# Patient Record
Sex: Female | Born: 1995 | Race: White | Hispanic: No | Marital: Single | Smoking: Never smoker
Health system: Southern US, Community
[De-identification: ages and names within clinical notes are randomized; demographics above are authoritative.]

## PROBLEM LIST (undated history)

## (undated) DIAGNOSIS — N200 Calculus of kidney: Secondary | ICD-10-CM

## (undated) DIAGNOSIS — B009 Herpesviral infection, unspecified: Secondary | ICD-10-CM

## (undated) DIAGNOSIS — F419 Anxiety disorder, unspecified: Secondary | ICD-10-CM

## (undated) DIAGNOSIS — M797 Fibromyalgia: Secondary | ICD-10-CM

## (undated) DIAGNOSIS — A6000 Herpesviral infection of urogenital system, unspecified: Secondary | ICD-10-CM

## (undated) DIAGNOSIS — F32A Depression, unspecified: Secondary | ICD-10-CM

## (undated) DIAGNOSIS — F329 Major depressive disorder, single episode, unspecified: Secondary | ICD-10-CM

## (undated) DIAGNOSIS — G43909 Migraine, unspecified, not intractable, without status migrainosus: Secondary | ICD-10-CM

## (undated) DIAGNOSIS — I959 Hypotension, unspecified: Secondary | ICD-10-CM

## (undated) DIAGNOSIS — M069 Rheumatoid arthritis, unspecified: Secondary | ICD-10-CM

## (undated) DIAGNOSIS — K589 Irritable bowel syndrome without diarrhea: Secondary | ICD-10-CM

## (undated) DIAGNOSIS — K219 Gastro-esophageal reflux disease without esophagitis: Secondary | ICD-10-CM

## (undated) DIAGNOSIS — K319 Disease of stomach and duodenum, unspecified: Secondary | ICD-10-CM

## (undated) DIAGNOSIS — Q796 Ehlers-Danlos syndrome, unspecified: Secondary | ICD-10-CM

## (undated) DIAGNOSIS — E079 Disorder of thyroid, unspecified: Secondary | ICD-10-CM

## (undated) DIAGNOSIS — M329 Systemic lupus erythematosus, unspecified: Secondary | ICD-10-CM

## (undated) DIAGNOSIS — IMO0002 Reserved for concepts with insufficient information to code with codable children: Secondary | ICD-10-CM

## (undated) HISTORY — DX: Depression, unspecified: F32.A

## (undated) HISTORY — PX: HIP ARTHROSCOPY: SUR88

## (undated) HISTORY — DX: Ehlers-Danlos syndrome, unspecified: Q79.60

## (undated) HISTORY — DX: Gastro-esophageal reflux disease without esophagitis: K21.9

## (undated) HISTORY — DX: Irritable bowel syndrome, unspecified: K58.9

## (undated) HISTORY — DX: Reserved for concepts with insufficient information to code with codable children: IMO0002

## (undated) HISTORY — DX: Herpesviral infection of urogenital system, unspecified: A60.00

## (undated) HISTORY — DX: Systemic lupus erythematosus, unspecified: M32.9

## (undated) HISTORY — DX: Major depressive disorder, single episode, unspecified: F32.9

## (undated) HISTORY — DX: Calculus of kidney: N20.0

## (undated) HISTORY — DX: Hypotension, unspecified: I95.9

## (undated) HISTORY — DX: Fibromyalgia: M79.7

## (undated) HISTORY — DX: Disease of stomach and duodenum, unspecified: K31.9

## (undated) HISTORY — PX: KNEE ARTHROSCOPY: SHX127

## (undated) HISTORY — DX: Anxiety disorder, unspecified: F41.9

## (undated) HISTORY — DX: Disorder of thyroid, unspecified: E07.9

## (undated) HISTORY — DX: Migraine, unspecified, not intractable, without status migrainosus: G43.909

## (undated) HISTORY — DX: Rheumatoid arthritis, unspecified: M06.9

## (undated) HISTORY — DX: Herpesviral infection, unspecified: B00.9

---
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
## 2016-11-26 HISTORY — PX: NISSEN FUNDOPLICATION: SHX2091

## 2017-04-19 ENCOUNTER — Encounter: Payer: Self-pay | Admitting: Gastroenterology

## 2017-04-19 ENCOUNTER — Ambulatory Visit (INDEPENDENT_AMBULATORY_CARE_PROVIDER_SITE_OTHER): Payer: BLUE CROSS/BLUE SHIELD | Admitting: Family Medicine

## 2017-04-19 ENCOUNTER — Encounter: Payer: Self-pay | Admitting: Family Medicine

## 2017-04-19 VITALS — BP 90/58 | HR 88 | Temp 98.4°F | Ht 64.5 in | Wt 134.9 lb

## 2017-04-19 DIAGNOSIS — M797 Fibromyalgia: Secondary | ICD-10-CM | POA: Insufficient documentation

## 2017-04-19 DIAGNOSIS — M329 Systemic lupus erythematosus, unspecified: Secondary | ICD-10-CM

## 2017-04-19 DIAGNOSIS — F419 Anxiety disorder, unspecified: Secondary | ICD-10-CM

## 2017-04-19 DIAGNOSIS — K224 Dyskinesia of esophagus: Secondary | ICD-10-CM

## 2017-04-19 DIAGNOSIS — Q796 Ehlers-Danlos syndrome, unspecified: Secondary | ICD-10-CM

## 2017-04-19 DIAGNOSIS — M069 Rheumatoid arthritis, unspecified: Secondary | ICD-10-CM | POA: Diagnosis not present

## 2017-04-19 DIAGNOSIS — K219 Gastro-esophageal reflux disease without esophagitis: Secondary | ICD-10-CM

## 2017-04-19 DIAGNOSIS — G43809 Other migraine, not intractable, without status migrainosus: Secondary | ICD-10-CM

## 2017-04-19 DIAGNOSIS — G43909 Migraine, unspecified, not intractable, without status migrainosus: Secondary | ICD-10-CM | POA: Insufficient documentation

## 2017-04-19 NOTE — Progress Notes (Addendum)
error 

## 2017-04-19 NOTE — Progress Notes (Addendum)
HPI:  Joy Taylor is here to establish care. Moved here to do biology degree at [employer] and plans to go to PA school. She reports she is primarily here today to get referrals as was seeing a number of specialists in [place].  Reported hx Ehlers-Danlos, fibromyalgia, rheumatoid arthritis, Lupus: - was seeing two different rheumatologist in [place] - reports one thought she had RA and one thought she had lupus -wants referral to a rheumatologist here -on plaquenil, gabapentin, pilocarpine for side effect to her meds prn  Migraines: -seeing neurologist in [place] -meds: depakote -wants referral to neurologist here  GERD/Esophageal spasm: -was seeing GI in [place], hx of regurg and aspiration -hx nissen -wants referral to GI here as still dealing with epigastric pain since surgery in Jan 2018 -not on any medications now -no vomiting, severe or persistent pain, weight loss   Irr periods and contraception: -meds: microgestin -utd pap smear, normal 1 week ago  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 Anxiety   . Ehlers-Danlos disease   . Fibromyalgia   . Genital herpes    diagnosed by GYN-per patient no treatment advised unless daily flare ups occur  . Lupus   . Migraine   . Rheumatoid aortitis   . Stomach problems     Past Surgical History:  Procedure Laterality Date  . GASTRIC FUNDOPLICATION  34/19/3790  . HIP ARTHROPLASTY  07/15/2016, 08/06/2015  . ROTATOR CUFF REPAIR Right 2010    Family History  Problem Relation Age of Onset  . Diabetes Mother   .  Diverticulitis Mother   . Macular degeneration Maternal Grandmother   . Heart disease Maternal Grandmother   . Hypertension Maternal Grandfather     Social History   Social History  . Marital status: Single    Spouse name: N/A  . Number of children: N/A  . Years of education: N/A   Social History Main Topics  . Smoking status: Never Smoker  . Smokeless tobacco: Never Used  . Alcohol use None  . Drug use: Unknown  . Sexual activity: Not Asked   Other Topics Concern  . 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 Reports this triggered all of her health problems.        Current Outpatient Prescriptions:  .  divalproex (DEPAKOTE) 500 MG DR tablet, Take 500 mg by mouth 2 (two) times daily., Disp: , Rfl:  .  gabapentin (NEURONTIN) 300 MG capsule, Take 300 mg by mouth 4 (four) times daily., Disp: , Rfl:  .  hydroxychloroquine (PLAQUENIL) 200 MG tablet, Take 350 mg by mouth daily., Disp: , Rfl:  .  Norethindrone Acet-Ethinyl Est (MICROGESTIN 1.5/30 PO), Take by mouth., Disp: , Rfl:  .  ondansetron (ZOFRAN) 4 MG tablet, Take 4 mg by mouth 2 (two) times daily., Disp: , Rfl:  .  PILOCARPINE HCL PO, Take 4 mg by mouth as needed., Disp: , Rfl:  .  Polyethylene Glycol 3350 (MIRALAX PO), Take by  mouth daily., Disp: , Rfl:  .  SUMAtriptan (IMITREX) 6 MG/0.5ML SOLN injection, Inject 6 mg into the skin as needed for migraine or headache. May repeat in 2 hours if headache persists or recurs., Disp: , Rfl:   EXAM:  Vitals:   04/19/17 1509  BP: (!) 90/58  Pulse: 88  Temp: 98.4 F (36.9 C)    Body mass index is 22.8 kg/m.  GENERAL: vitals reviewed and listed above, alert, oriented, appears well hydrated and in no acute distress  HEENT: atraumatic, conjunttiva clear, no obvious abnormalities on inspection of external nose and ears  NECK: no obvious masses on  inspection  LUNGS: clear to auscultation bilaterally, no wheezes, rales or rhonchi, good air movement  CV: HRRR, no peripheral edema  MS: moves all extremities without noticeable abnormality  PSYCH: pleasant and cooperative, no obvious depression or anxiety, fairly flat affect  ASSESSMENT AND PLAN:  Discussed the following assessment and plan: More than 50% of over 30 minutes spent in total in caring for this patient was spent face-to-face with the patient, counseling and/or coordinating care. Referral placed per her request - she seems accustomed to specialist handling her many medical problems.   Fibromyalgia - Plan: Ambulatory referral to Rheumatology  Rheumatoid arthritis, involving unspecified site, unspecified rheumatoid factor presence ([place]) - Plan: Ambulatory referral to Rheumatology  Systemic lupus erythematosus, unspecified SLE type, unspecified organ involvement status ([place]) - Plan: Ambulatory referral to Rheumatology  Ehlers-Danlos disease  Other migraine without status migrainosus, not intractable - Plan: Ambulatory referral to Neurology  Gastroesophageal reflux disease, esophagitis presence not specified - Plan: Ambulatory referral to Gastroenterology  Esophageal spasm - Plan: Ambulatory referral to Gastroenterology  Anxiety - advised CBT with Behavioral health  -We reviewed the PMH, PSH, FH, SH, Meds and Allergies. -We have asked for records for pertinent exams, studies, vaccines and notes from previous providers. -referrals placed per her request -advised counseling and provided options and contact information to schedule - she is not interested in medications for mental health  -We have advised patient to follow up per instructions below.   -Patient advised to return or notify a doctor immediately if symptoms worsen or persist or new concerns arise.  Patient Instructions  BEFORE YOU LEAVE: -follow up: yearly for CPE and as needed  -We placed several  referrals for you as discussed. It usually takes about 1-2 weeks to process and schedule referrals. If you have not heard from [place] regarding these appointments in 2 weeks please contact our office.  Call the number provider for Behavioral Health/Counseling.  It was nice to meet you today.     WE NOW OFFER   [facility] FAST TRACK  SAME DAY Appointments for ACUTE CARE  Such as: Sprains, Injuries, cuts, abrasions, rashes, muscle pain, joint pain, back pain Colds, flu, sore throats, headache, allergies, cough, fever  Ear pain, sinus and eye infections Abdominal pain, nausea, vomiting, diarrhea, upset stomach Animal/insect bites  3 Easy Ways to Schedule: Walk-In Scheduling Call in scheduling Mychart Sign-up: [email or web address]                Colin Benton R.

## 2017-04-19 NOTE — Patient Instructions (Addendum)
BEFORE YOU LEAVE: -follow up: yearly for CPE and as needed  -We placed several referrals for you as discussed. It usually takes about 1-2 weeks to process and schedule referrals. If you have not heard from [place] regarding these appointments in 2 weeks please contact our office.  Call the number provider for Behavioral Health/Counseling.  It was nice to meet you today.     WE NOW OFFER   [facility] FAST TRACK  SAME DAY Appointments for ACUTE CARE  Such as: Sprains, Injuries, cuts, abrasions, rashes, muscle pain, joint pain, back pain Colds, flu, sore throats, headache, allergies, cough, fever  Ear pain, sinus and eye infections Abdominal pain, nausea, vomiting, diarrhea, upset stomach Animal/insect bites  3 Easy Ways to Schedule: Walk-In Scheduling Call in scheduling Mychart Sign-up: [email or web address]

## 2017-04-20 ENCOUNTER — Encounter: Payer: Self-pay | Admitting: Neurology

## 2017-05-06 NOTE — Progress Notes (Signed)
HPI:  Acute visit for:  Med refills. Has migraines - will be seeing neurologist soon but needs refill on Depakote and triptan to get to appt. Out the last 2 days. She also reports she thinks she gets hypoglycemic at times. Reports has intermittent spells occ of fatigue and sweating and when she eats something it resolves. has never checked her sugar at these times. No hx of diabetes.  Reports she is seeing rheumatologist locally now for ? RA, Harless Litten, ? lups and now ? [place]. Says he will send notes. Did imaging and lots of labs there. 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 Reports needs this for her anxiety as it is "not stable" and she therfore needs special assistance for school and work. Will need letters of medical need for this. Not seeing psychiatrist here yet. Does not want to take medications for anxiety. Has denied depression.   ROS: See pertinent positives and negatives per HPI.  Past Medical History:  Diagnosis Date  . Anxiety   . Depression   . Ehlers-Danlos disease   . Fibromyalgia   . Genital herpes    diagnosed by GYN-per patient no treatment advised unless daily flare ups occur  . GERD (gastroesophageal reflux disease)   . Kidney stones   . Lupus   . Migraine   . Rheumatoid aortitis   . Stomach problems     Past Surgical History:  Procedure Laterality Date  . GASTRIC FUNDOPLICATION  23/55/7322  . HIP ARTHROPLASTY  07/15/2016, 08/06/2015  . ROTATOR CUFF REPAIR Right 2010    Family History  Problem Relation Age of Onset  . Diabetes Mother   . Diverticulitis Mother   . Macular degeneration Maternal Grandmother   . Heart disease Maternal Grandmother   . Hypertension Maternal Grandfather     Social History   Social History  . Marital status: Single    Spouse name: N/A  . Number of children: N/A  . Years of education: N/A   Social History Main Topics  . Smoking status: Never Smoker  .  Smokeless tobacco: Never Used  . Alcohol use None  . Drug use: Unknown  . Sexual activity: Not Asked   Other Topics Concern  . 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 Reports this triggered all of her health problems.        Current Outpatient Prescriptions:  .  divalproex (DEPAKOTE) 500 MG DR tablet, Take 1 tablet (500 mg total) by mouth 2 (two) times daily., Disp: 60 tablet, Rfl: 0 .  gabapentin (NEURONTIN) 300 MG capsule, Take 300 mg by mouth 4 (four) times daily., Disp: , Rfl:  .  hydroxychloroquine (PLAQUENIL) 200 MG tablet, Take 350 mg by mouth daily., Disp: , Rfl:  .  Norethindrone Acet-Ethinyl Est (MICROGESTIN 1.5/30 PO), Take by mouth., Disp: , Rfl:  .  ondansetron (ZOFRAN) 4 MG tablet, Take 4 mg by mouth 2 (two) times daily., Disp: , Rfl:  .  PILOCARPINE HCL PO, Take 4 mg by mouth as needed., Disp: , Rfl:  .  Polyethylene Glycol 3350 (MIRALAX PO), Take by mouth daily., Disp: , Rfl:  .  SUMAtriptan (IMITREX) 6 MG/0.5ML SOLN injection, Inject 0.5 mLs (6 mg total) into the skin once. May repeat in 2 hours if headache persists or recurs., Disp: 0.5  mL, Rfl: 1  EXAM:  Vitals:   05/07/17 1443  BP: 96/68  Pulse: 84  Temp: 98.1 F (36.7 C)    Body mass index is 22.88 kg/m.  GENERAL: vitals reviewed and listed above, alert, oriented, appears well hydrated and in no acute distress  HEENT: atraumatic, conjunttiva clear, no obvious abnormalities on inspection of external nose and ears  NECK: no obvious masses on inspection  LUNGS: clear to auscultation bilaterally, no wheezes, rales or rhonchi, good air movement  CV: HRRR, no peripheral edema  MS: moves all extremities without noticeable abnormality  PSYCH: pleasant and cooperative, no obvious depression or anxiety  ASSESSMENT AND PLAN:  Discussed the following assessment and plan:  Other  migraine without status migrainosus, not intractable -refilled meds as bridge to neurology appt  Hypoglycemia -home glu monitor provided and advised to check blood sugar with these spells and follow up with [place] to let us know results  GAD (generalized anxiety disorder) -advised psychiatry evaluation and management to assist in any needed documentation for mental health disability and also to assist in hopefully helping to get her to a more functional point given her ambitious career goals  -Patient advised to return or notify a doctor immediately if symptoms worsen or persist or new concerns arise.  Patient Instructions  BEFORE YOU LEAVE: Glucose monitor. Contact innformation for [facility].  Check blood sugar when you feel is low and keep log. Let us know if this is running low and the readings.  Send refills on your migraine medications to last until your appointment with the neurologist.  Call today to set up an appointment with the psychiatry office regarding for anxiety and mental health.    Colin Benton R., DO

## 2017-05-07 ENCOUNTER — Encounter: Payer: Self-pay | Admitting: Family Medicine

## 2017-05-07 ENCOUNTER — Ambulatory Visit (INDEPENDENT_AMBULATORY_CARE_PROVIDER_SITE_OTHER): Payer: BLUE CROSS/BLUE SHIELD | Admitting: Family Medicine

## 2017-05-07 VITALS — BP 96/68 | HR 84 | Temp 98.1°F | Wt 135.4 lb

## 2017-05-07 DIAGNOSIS — F411 Generalized anxiety disorder: Secondary | ICD-10-CM

## 2017-05-07 DIAGNOSIS — E162 Hypoglycemia, unspecified: Secondary | ICD-10-CM | POA: Diagnosis not present

## 2017-05-07 DIAGNOSIS — G43809 Other migraine, not intractable, without status migrainosus: Secondary | ICD-10-CM | POA: Diagnosis not present

## 2017-05-07 MED ORDER — SUMATRIPTAN SUCCINATE 6 MG/0.5ML ~~LOC~~ SOLN: 6.0000 mg | Freq: Once | SUBCUTANEOUS | 1 refills | Status: DC

## 2017-05-07 MED ORDER — DIVALPROEX SODIUM 500 MG PO DR TAB: 500.0000 mg | DELAYED_RELEASE_TABLET | Freq: Two times a day (BID) | ORAL | 0 refills | Status: DC

## 2017-05-07 NOTE — Patient Instructions (Signed)
BEFORE YOU LEAVE: Glucose monitor. Contact innformation for [facility].  Check blood sugar when you feel is low and keep log. Let us know if this is running low and the readings.  Send refills on your migraine medications to last until your appointment with the neurologist.  Call today to set up an appointment with the psychiatry office regarding for anxiety and mental health.

## 2017-05-25 ENCOUNTER — Encounter: Payer: Self-pay | Admitting: Gastroenterology

## 2017-05-25 ENCOUNTER — Encounter (INDEPENDENT_AMBULATORY_CARE_PROVIDER_SITE_OTHER): Payer: Self-pay

## 2017-05-25 ENCOUNTER — Ambulatory Visit (INDEPENDENT_AMBULATORY_CARE_PROVIDER_SITE_OTHER): Payer: BLUE CROSS/BLUE SHIELD | Admitting: Gastroenterology

## 2017-05-25 ENCOUNTER — Other Ambulatory Visit (INDEPENDENT_AMBULATORY_CARE_PROVIDER_SITE_OTHER): Payer: BLUE CROSS/BLUE SHIELD

## 2017-05-25 VITALS — BP 84/56 | HR 76 | Ht 64.75 in | Wt 133.0 lb

## 2017-05-25 DIAGNOSIS — R0789 Other chest pain: Secondary | ICD-10-CM

## 2017-05-25 DIAGNOSIS — R109 Unspecified abdominal pain: Secondary | ICD-10-CM

## 2017-05-25 DIAGNOSIS — R140 Abdominal distension (gaseous): Secondary | ICD-10-CM | POA: Diagnosis not present

## 2017-05-25 DIAGNOSIS — R1310 Dysphagia, unspecified: Secondary | ICD-10-CM

## 2017-05-25 DIAGNOSIS — Z9889 Other specified postprocedural states: Secondary | ICD-10-CM | POA: Diagnosis not present

## 2017-05-25 LAB — IGA: IgA: 170 mg/dL (ref 68–378)

## 2017-05-25 MED ORDER — OMEPRAZOLE 40 MG PO CPDR: 40.0000 mg | DELAYED_RELEASE_CAPSULE | Freq: Every day | ORAL | 1 refills | Status: DC

## 2017-05-25 NOTE — Progress Notes (Signed)
HPI :  21 y/o female with a history of Ehlers'Danlos syndrome, RA / SLE, and history of reflux who had Nissen fundoplication in 07/6294, seen in consultation for Dr. Colin Benton today for chest and upper abdominal symptoms. She reported severe regurgitation of gastric contents refractory to medications, which led to this surgery in January, she states she had a large hiatal hernia with aspiration in the past from it as well.   She reports since the surgery she reports her regurgitation has completely resolved, but she has had some new symptoms that have arose. She reports she still has some "spasms" in her lower chest. She reports some dysphagia at times with solids, usually if she eats too fast. She reports initially after the surgery it was severe, but now dysphagia getting better over time. She feels the dysphagia mostly up high in her chest. She has some discomfort in her lower chest after she eats, "stabbing pains", which can occur at night or after she eats. She feels it for roughly 30 minutes after she eats. She has not been able to belch, and does have some significant bloating in her stomach after she eats. She is not sure if she is having heartburn. She feels more so a "spasm".   She had an UGI series on 03/05/2017 which I reviewed - no abnormality reported, surgical site looked okay.   She's had a prior EGD, she does not think she had a manometry prior to her surgery. She thinks she may have had a gastric emptying study, but no records available. She has a history of IBS-C, not much loose stools. She takes miralax daily to help regulate her bowels and currently these symptoms are under good control.     Past Medical History:  Diagnosis Date  . Anxiety   . Depression   . Ehlers-Danlos disease   . Fibromyalgia   . Genital herpes    diagnosed by GYN-per patient no treatment advised unless daily flare ups occur  . GERD (gastroesophageal reflux disease)   . IBS (irritable bowel syndrome)     . Kidney stones   . Lupus   . Migraine   . Rheumatoid arthritis ([place])   . Stomach problems      Past Surgical History:  Procedure Laterality Date  . HIP ARTHROSCOPY Right 07/15/2016, 08/06/2015  . NISSEN FUNDOPLICATION  28/41/3244  . ROTATOR CUFF REPAIR Right 2010   Family History  Problem Relation Age of Onset  . Diabetes Mother   . Diverticulitis Mother   . Colon polyps Mother   . Macular degeneration Maternal Grandmother   . Heart disease Maternal Grandmother   . Hypertension Maternal Grandfather    Social History  Substance Use Topics  . Smoking status: Never Smoker  . Smokeless tobacco: Never Used  . Alcohol use No   Current Outpatient Prescriptions  Medication Sig Dispense Refill  . divalproex (DEPAKOTE) 500 MG DR tablet Take 1 tablet (500 mg total) by mouth 2 (two) times daily. 60 tablet 0  . gabapentin (NEURONTIN) 300 MG capsule Take 300 mg by mouth 4 (four) times daily.    . hydroxychloroquine (PLAQUENIL) 200 MG tablet Take 350 mg by mouth daily.    . Norethindrone Acet-Ethinyl Est (MICROGESTIN 1.5/30 PO) Take by mouth.    . ondansetron (ZOFRAN) 4 MG tablet Take 4 mg by mouth 2 (two) times daily.    Marland Kitchen PILOCARPINE HCL PO Take 4 mg by mouth as needed.    . Polyethylene Glycol 3350 (MIRALAX  PO) Take by mouth daily.    . SUMAtriptan (IMITREX) 6 MG/0.5ML SOLN injection Inject 0.5 mLs (6 mg total) into the skin once. May repeat in 2 hours if headache persists or recurs. 0.5 mL 1   No current facility-administered medications for this visit.    No Known Allergies   Review of Systems: All systems reviewed and negative except where noted in HPI.   No results found for: WBC, HGB, HCT, MCV, PLT   Physical Exam: BP (!) 84/56 (BP Location: Left Arm, Patient Position: Sitting, Cuff Size: Normal)   Pulse 76   Ht 5' 4.75" (1.645 m) Comment: height measured without shoes  Wt 133 lb (60.3 kg)   LMP 05/18/2017   BMI 22.30 kg/m  Constitutional: Pleasant,well-developed,  female in no acute distress. HEENT: Normocephalic and atraumatic. Conjunctivae are normal. No scleral icterus. Neck supple.  Cardiovascular: Normal rate, regular rhythm.  Pulmonary/chest: Effort normal and breath sounds normal. No wheezing, rales or rhonchi. Abdominal: Soft, nondistended, nontender. There are no masses palpable. No hepatomegaly. Extremities: no edema Lymphadenopathy: No cervical adenopathy noted. Neurological: Alert and oriented to person place and time. Skin: Skin is warm and dry. No rashes noted. Psychiatric: Normal mood and affect. Behavior is normal.   ASSESSMENT AND PLAN: 21 year old female with history as above, s/p Nissen in January 2018, here for chest symptoms that have intervally started after her surgery. Regurgitation has completely resolved, now with periodic dysphagia and intermittent chest discomfort and abdominal bloating in the postprandial setting.   We discussed that dysphagia is common after having a Nissen, and treatment of this with dilation could disrupt her surgical site and with potential for recurrent reflux. If her dysphagia is mild and continuing to improve I would continue to monitor this and avoid intervention at this time.   In regards to her postprandial chest discomfort and bloating, I suspect she does have a component of gas bloat syndrome following her surgery, and she also may either have esophageal spasm or perhaps a component of reflux. Going to recommend over-the-counter simethicone with meals to help reduce gas. She can try using some peppermint althoids for empiric treatment of spasm see if this helps at all. I'll also give her a trial of omeprazole to see if this makes any difference. If no improvement in symptoms persist may consider an endoscopy but will hold off on this for now.   Otherwise for her bloating, some of this could also be due to underlying IBS. Discussed trial of low FODMAP diet with her and she wants to try this. I will  also screen for celiac disease to ensure negative.  She agreed with the plan as outlined she can follow-up with me as needed.  Payette Cellar, MD [facility] Pager [phone]  CC: Colin Benton, DO

## 2017-05-25 NOTE — Patient Instructions (Signed)
If you are age 23 or older, your body mass index should be between 23-30. Your Body mass index is 22.3 kg/m. If this is out of the aforementioned range listed, please consider follow up with your Primary Care Provider.  If you are age 53 or younger, your body mass index should be between 19-25. Your Body mass index is 22.3 kg/m. If this is out of the aformentioned range listed, please consider follow up with your Primary Care Provider.   We have sent the following medications to your pharmacy for you to pick up at your convenience:  Omeprazole  Your physician has requested that you go to the basement for the following lab work before leaving today:  Celiac Labs  You have been given a Low FodMap diet to follow.  Please purchase over the counter Simeticone and use as needed.  Please follow up as needed with Dr. Havery Moros.  Thank you.

## 2017-05-26 LAB — TISSUE TRANSGLUTAMINASE, IGA: Tissue Transglutaminase Ab, IgA: 1 U/mL (ref <4)

## 2017-05-27 ENCOUNTER — Encounter: Payer: Self-pay | Admitting: Gastroenterology

## 2017-06-03 ENCOUNTER — Other Ambulatory Visit: Payer: Self-pay | Admitting: Family Medicine

## 2017-06-03 NOTE — Telephone Encounter (Signed)
I left a message for the pt to return my call. 

## 2017-06-03 NOTE — Telephone Encounter (Signed)
We had provided short bridge to her specialist appointment. When will she see neurology? Can refill once for 30 days if needed to get to that appt.

## 2017-06-07 NOTE — Telephone Encounter (Signed)
No answer at the pts cell number. °

## 2017-06-08 NOTE — Telephone Encounter (Signed)
Patient has not returned a call to the office as of today.  Rx denial sent to the pts pharmacy to call the office for an appt.

## 2017-06-17 ENCOUNTER — Other Ambulatory Visit: Payer: Self-pay | Admitting: Family Medicine

## 2017-07-21 ENCOUNTER — Other Ambulatory Visit: Payer: Self-pay | Admitting: Gastroenterology

## 2017-07-23 ENCOUNTER — Telehealth: Payer: Self-pay | Admitting: Family Medicine

## 2017-07-23 ENCOUNTER — Ambulatory Visit: Payer: BLUE CROSS/BLUE SHIELD | Admitting: Neurology

## 2017-07-23 NOTE — Telephone Encounter (Signed)
Pt would like a neurologist who specialize in fibromyaglia and dr Tomi Likens does not

## 2017-07-23 NOTE — Telephone Encounter (Signed)
I left a message for the pt to return my call. 

## 2017-07-23 NOTE — Telephone Encounter (Signed)
I am not aware of a neurologist in the area that specializes in fibromyalgia - this is something rheumatologist are usually more knowledgeable about.  If she is aware of a neurologist that specializes in this and could provide the name, happy to re-refer. The referral to Dr. Tomi Likens was for migraines.

## 2017-07-26 ENCOUNTER — Ambulatory Visit: Payer: BLUE CROSS/BLUE SHIELD | Admitting: Neurology

## 2017-07-30 ENCOUNTER — Ambulatory Visit: Payer: BLUE CROSS/BLUE SHIELD | Admitting: Family Medicine

## 2017-08-02 NOTE — Telephone Encounter (Signed)
I called the pt and informed her of the message below

## 2017-08-05 ENCOUNTER — Ambulatory Visit (INDEPENDENT_AMBULATORY_CARE_PROVIDER_SITE_OTHER): Payer: BLUE CROSS/BLUE SHIELD | Admitting: Family Medicine

## 2017-08-05 ENCOUNTER — Encounter: Payer: Self-pay | Admitting: Family Medicine

## 2017-08-05 VITALS — BP 98/62 | HR 78 | Temp 98.6°F | Wt 141.8 lb

## 2017-08-05 DIAGNOSIS — M797 Fibromyalgia: Secondary | ICD-10-CM | POA: Diagnosis not present

## 2017-08-05 DIAGNOSIS — Q796 Ehlers-Danlos syndrome, unspecified: Secondary | ICD-10-CM

## 2017-08-05 DIAGNOSIS — M069 Rheumatoid arthritis, unspecified: Secondary | ICD-10-CM | POA: Diagnosis not present

## 2017-08-05 DIAGNOSIS — M329 Systemic lupus erythematosus, unspecified: Secondary | ICD-10-CM | POA: Diagnosis not present

## 2017-08-05 DIAGNOSIS — M79661 Pain in right lower leg: Secondary | ICD-10-CM | POA: Diagnosis not present

## 2017-08-05 DIAGNOSIS — E162 Hypoglycemia, unspecified: Secondary | ICD-10-CM

## 2017-08-05 MED ORDER — NORETHINDRONE ACET-ETHINYL EST 1.5-30 MG-MCG PO TABS: 1.0000 | ORAL_TABLET | Freq: Every day | ORAL | 3 refills | Status: DC

## 2017-08-05 NOTE — Patient Instructions (Signed)
BEFORE YOU LEAVE: -calf strain exercises -obtain labs/notes from rheumatologist -follow up: in one month if symptoms persist and may need to do labs and neurology referral  Do the exercises provided gently 4 days per week.  Topical menthol or capsaicin for discomfort if needed and heat for 15 minutes twice daily.  -We placed a referral for you as discussed to the endocrinologist. It usually takes about 1-2 weeks to process and schedule this referral. If you have not heard from [place] regarding this appointment in 2 weeks please contact our office.  -Please let us know if you decide what neurologist you wish to see

## 2017-08-05 NOTE — Progress Notes (Signed)
HPI:  Acute visit for:  R calf discomfort: -for about 2 weeks -mild pain intermittently, intermittent cold feeling in one spot post calf -no known injury, weakness, numbness, fevers, malaise, symptoms elsewhere, Swelling, redness  Requests referral to endocrinologist. Reports rheumatologist told her needs referral to endo for hypoglycemia and multiple other issues, but she is not sure what. She has not confirmed hypoglycemia, but feel Glu is always low "in 70s" when checked on labs and feels she has hypoglycemic episodes. Feels fatigued, then feels better when she eats.  Did not see neurologist. Wants a neurologist to manage her fibromyalgia and migraines as saw a neurolgoist in the past whom was very knowledgeable about and managed fibromyalgia. She reports she is doing her research on who she wants to see.  Requests refill on birth control pill. Currently on period. Denies any missed doses.  Screening for STI's.  Declines flu shot.  ROS: See pertinent positives and negatives per HPI.  Past Medical History:  Diagnosis Date  . Anxiety   . Depression   . Ehlers-Danlos disease   . Fibromyalgia   . Genital herpes    diagnosed by GYN-per patient no treatment advised unless daily flare ups occur  . GERD (gastroesophageal reflux disease)   . IBS (irritable bowel syndrome)   . Kidney stones   . Lupus   . Migraine   . Rheumatoid arthritis ([place])   . Stomach problems     Past Surgical History:  Procedure Laterality Date  . HIP ARTHROSCOPY Right 07/15/2016, 08/06/2015  . NISSEN FUNDOPLICATION  69/67/8938  . ROTATOR CUFF REPAIR Right 2010    Family History  Problem Relation Age of Onset  . Diabetes Mother   . Diverticulitis Mother   . Colon polyps Mother   . Macular degeneration Maternal Grandmother   . Heart disease Maternal Grandmother   . Hypertension Maternal Grandfather     Social History   Social History  . Marital status: Single    Spouse name: N/A  . Number of  children: 0  . Years of education: N/A   Occupational History  . [employer]    Social History Main Topics  . Smoking status: Never Smoker  . Smokeless tobacco: Never Used  . Alcohol use No  . Drug use: No  . Sexual activity: Not Asked   Other Topics Concern  . 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 Reports this triggered all of her health problems.        Current Outpatient Prescriptions:  .  divalproex (DEPAKOTE) 500 MG DR tablet, Take 1 tablet (500 mg total) by mouth 2 (two) times daily., Disp: 60 tablet, Rfl: 0 .  gabapentin (NEURONTIN) 300 MG capsule, Take 300 mg by mouth 4 (four) times daily., Disp: , Rfl:  .  hydroxychloroquine (PLAQUENIL) 200 MG tablet, Take 350 mg by mouth daily., Disp: , Rfl:  .  Norethindrone Acetate-Ethinyl Estradiol (MICROGESTIN) 1.5-30 MG-MCG tablet, Take 1 tablet by mouth daily., Disp: 3 Package, Rfl: 3 .  omeprazole (PRILOSEC) 40 MG capsule, TAKE 1 CAPSULE BY MOUTH EVERY DAY, Disp: 30 capsule, Rfl: 1 .  ondansetron (ZOFRAN) 4 MG tablet, Take 4 mg by mouth 2 (two) times daily., Disp: , Rfl:  .  PILOCARPINE HCL PO, Take 4 mg by mouth as needed., Disp: , Rfl:  .  Polyethylene  Glycol 3350 (MIRALAX PO), Take by mouth daily., Disp: , Rfl:  .  SUMAtriptan (IMITREX) 6 MG/0.5ML SOLN injection, Inject 0.5 mLs (6 mg total) into the skin once. May repeat in 2 hours if headache persists or recurs., Disp: 0.5 mL, Rfl: 1  EXAM:  Vitals:   08/05/17 1309  BP: 98/62  Pulse: 78  Temp: 98.6 F (37 C)  SpO2: 98%    Body mass index is 23.78 kg/m.  GENERAL: vitals reviewed and listed above, alert, oriented, appears well hydrated and in no acute distress  HEENT: atraumatic, conjunttiva clear, no obvious abnormalities on inspection of external nose and ears  NECK: no obvious masses on inspection  LUNGS: clear to auscultation  bilaterally, no wheezes, rales or rhonchi, good air movement  CV: HRRR, no peripheral edema, no TTP over deep veins  MS/NEURO: moves all extremities without noticeable abnormality, no overlying redness, swelling or skin changes, minimal TTP in post lat calf muscle, normal strength/sensitivity to light touch and DTRs in lower extremities bilat  PSYCH: pleasant and cooperative, no obvious depression or anxiety  ASSESSMENT AND PLAN:  Discussed the following assessment and plan:  Right calf pain -we discussed possible serious and likely etiologies, workup and treatment, treatment risks and return precautions, ? Muscular  -after this discussion, Celise opted for treatment per instructions, obtain labs from rheum (TSH, b12, duplex and neurology referral if persists) -follow up advised 1 month -of course, we advised Tareva  to return or notify a doctor immediately if symptoms worsen or persist or new concerns arise.  Hypoglycemia - -patient reported -she has not been checking blood sugar during these episodes with the monitor we gave her -referral per her adamant request, also will obtain rheum notes - not sure why they would have advised endo referral without placing  Rheumatoid arthritis, involving unspecified site, unspecified rheumatoid factor presence (HCC) Ehlers-Danlos disease Systemic lupus erythematosus, unspecified SLE type, unspecified organ involvement status ([place]) Fibromyalgia -sees rheumatologist  Refilled birth control  -Patient advised to return or notify a doctor immediately if symptoms worsen or persist or new concerns arise.  Patient Instructions  BEFORE YOU LEAVE: -calf strain exercises -obtain labs/notes from rheumatologist -follow up: in one month if symptoms persist and may need to do labs and neurology referral  Do the exercises provided gently 4 days per week.  Topical menthol or capsaicin for discomfort if needed and heat for 15 minutes twice daily.  -We  placed a referral for you as discussed to the endocrinologist. It usually takes about 1-2 weeks to process and schedule this referral. If you have not heard from [place] regarding this appointment in 2 weeks please contact our office.  -Please let us know if you decide what neurologist you wish to see    Joy Taylor., DO

## 2017-09-22 ENCOUNTER — Telehealth: Payer: Self-pay | Admitting: Family Medicine

## 2017-09-22 DIAGNOSIS — M797 Fibromyalgia: Secondary | ICD-10-CM

## 2017-09-22 DIAGNOSIS — G43809 Other migraine, not intractable, without status migrainosus: Secondary | ICD-10-CM

## 2017-09-22 NOTE — Telephone Encounter (Signed)
Pt has found neurologist that will see her for headaches and fibromyalgia. [facility] [address]  [address]. Phone [phone]

## 2017-09-23 ENCOUNTER — Other Ambulatory Visit: Payer: Self-pay | Admitting: Gastroenterology

## 2017-09-23 NOTE — Telephone Encounter (Signed)
Referral placed and I was unable to leave a message at her cell number due to the voicemail being full.

## 2017-12-06 ENCOUNTER — Ambulatory Visit: Payer: Self-pay | Admitting: *Deleted

## 2017-12-06 NOTE — Telephone Encounter (Signed)
Called in c/o having a white vaginal discharge with itching after taking a course of Amoxicillin for a sinus infection while in [place].   She went to the urgent care center in [place] and was prescribed the Amoxicillin. I went over the home care advice with her.  She is going to try the Monistat 7 day course OTC.   She verbalized understanding of when to call us back if she did not improve within the next 3-7 days after starting the Monistat.    Reason for Disposition . Symptoms of a vaginal yeast infection (i.e., white, thick, cottage-cheese-like, itchy, not bad smelling discharge)  Answer Assessment - Initial Assessment Questions 1. DISCHARGE: "Describe the discharge." (e.g., white, yellow, green, gray, foamy, cottage cheese-like)     White 2. ODOR: "Is there a bad odor?"     Iching and yes bad odor 3. ONSET: "When did the discharge begin?"     A few days   3 days 4. RASH: "Is there a rash in that area?" If so, ask: "Describe it." (e.g., redness, blisters, sores, bumps)     No 5. ABDOMINAL PAIN: "Are you having any abdominal pain?" If yes: "What does it feel like?" (e.g., crampy, dull, intermittent, constant)      Having cramps it's hard to tell because I have IBS 6. ABDOMINAL PAIN SEVERITY: If present, ask: "How bad is it?"  (e.g., Scale 1-10; mild, moderate, or severe)   - MILD (1-3): doesn't interfere with normal activities, abdomen soft and not tender to touch    - MODERATE (4-7): interferes with normal activities or awakens from sleep, tender to touch    - SEVERE (8-10): excruciating pain, doubled over, unable to do any normal activities     Irritation.   Uncomfortable 7. CAUSE: "What do you think is causing the discharge?"     Amoxicillin for a sinus infection while I was in [place] 8. OTHER SYMPTOMS: "Do you have any other symptoms?" (e.g., fever, itching, vaginal bleeding, pain with urination)     Itching.   No fever or bleeding or burning 9. EDD: "What date are you expecting to deliver?"     N/A 10. PREGNANCY: "How many weeks pregnant are you?"       No  Protocols used: PREGNANCY - VAGINAL DISCHARGE-A-AH

## 2017-12-20 ENCOUNTER — Other Ambulatory Visit: Payer: Self-pay | Admitting: Gastroenterology

## 2017-12-22 ENCOUNTER — Telehealth: Payer: Self-pay | Admitting: Neurology

## 2017-12-22 ENCOUNTER — Ambulatory Visit: Payer: BLUE CROSS/BLUE SHIELD | Admitting: Neurology

## 2017-12-22 ENCOUNTER — Telehealth: Payer: Self-pay | Admitting: Family Medicine

## 2017-12-22 NOTE — Telephone Encounter (Signed)
I spoke with pt and she has appointment scheduled for February 16, 2018 with neurology. She is requesting 30 day supply with refill until her appointment if possible. She went for a office visit and stated that she was a few minutes late for that appointment, advised to reschedule.

## 2017-12-22 NOTE — Telephone Encounter (Signed)
Pt showed up 20 min late for apt. Wants a call from the nurse regarding medication (Depakote and gabapentin). She is resch for April.  Best call back [phone]

## 2017-12-22 NOTE — Telephone Encounter (Signed)
I scheduled patient with Dr. Jannifer Franklin on 12-29-17 at 9am check in at 8:30am and advised her if medication questions address with her PCP.per previous message.

## 2017-12-22 NOTE — Telephone Encounter (Signed)
Called and LVM for pt to call.   We can fit her back in next Wednesday, 12/29/17 at [place], [place] 830am for a new patient appt.   Her medication questions will have to be addressed with PCP until she can be seen here in the office.

## 2017-12-22 NOTE — Telephone Encounter (Signed)
Looks like patient called back and agreed to appt on 12/29/17 at 9am with CW,MD

## 2017-12-22 NOTE — Telephone Encounter (Signed)
No showed for appointment for new patient appointment

## 2017-12-22 NOTE — Telephone Encounter (Signed)
Per Dr. Eugenie Birks, we cannot provide any medical advice until patient is seen in the office. We can offer to work pt back in sooner per CW, MD. She needs to address any medication questions with PCP until she is seen in our office.

## 2017-12-22 NOTE — Telephone Encounter (Signed)
Copied from [place]. Topic: Quick Communication - See Telephone Encounter >> Dec 22, 2017  9:02 AM Aurelio Brash B wrote: CRM for notification. See Telephone encounter for:  Pt is asking for a call from Dr Noel Journey nurse to discuss divalproex (DEPAKOTE) 500 MG DR tablet,  she states her pharmacy is out of the med   12/22/17.[id number] [place], [address] [place] [phone] (Phone) [phone] (Fax)

## 2017-12-22 NOTE — Telephone Encounter (Signed)
Please call patient. She is supposed to be seeing a neurologist for this. From review of her chart it looks like she did not go to her neurology appointment, but thankfully they have agreed to see her 12/29/17. Ok to refill for 2 weeks to get to that appointment. Thanks.

## 2017-12-23 ENCOUNTER — Encounter: Payer: Self-pay | Admitting: Neurology

## 2017-12-23 MED ORDER — DIVALPROEX SODIUM 500 MG PO DR TAB: 500.0000 mg | DELAYED_RELEASE_TABLET | Freq: Two times a day (BID) | ORAL | 0 refills | Status: DC

## 2017-12-23 NOTE — Telephone Encounter (Signed)
I called Dr Jannifer Franklin' office and spoke with Lovey Newcomer and she stated the pt has an appt on 2/13 at 8:30am.  I called the pt and left a detailed message at her cell number with this information and a week's supply of Depakote was sent to her pharmacy.

## 2017-12-23 NOTE — Telephone Encounter (Signed)
Can you check with the [facility] office (Dr. Jannifer Franklin) when her appointment is? Their notes in Epic say they confirmed appt with her for 12/29/17. I don't want her to miss another appointment as they may not agree to see her then. Then call pt and let her know appointment date. Refill medication to the appointment. Thanks.

## 2017-12-24 NOTE — Telephone Encounter (Signed)
Patient states pharmacy has not received her refill, requesting call once sent [phone]

## 2017-12-24 NOTE — Telephone Encounter (Signed)
I called [facility], spoke with Lattie Haw and she stated the Rx is ready for the pt to pick up.  I called the pt and informed her of this.

## 2017-12-29 ENCOUNTER — Ambulatory Visit (INDEPENDENT_AMBULATORY_CARE_PROVIDER_SITE_OTHER): Payer: BLUE CROSS/BLUE SHIELD | Admitting: Neurology

## 2017-12-29 ENCOUNTER — Encounter (INDEPENDENT_AMBULATORY_CARE_PROVIDER_SITE_OTHER): Payer: Self-pay

## 2017-12-29 ENCOUNTER — Encounter: Payer: Self-pay | Admitting: Neurology

## 2017-12-29 VITALS — BP 124/88 | HR 80 | Ht 64.5 in | Wt 142.5 lb

## 2017-12-29 DIAGNOSIS — R413 Other amnesia: Secondary | ICD-10-CM | POA: Diagnosis not present

## 2017-12-29 DIAGNOSIS — G43809 Other migraine, not intractable, without status migrainosus: Secondary | ICD-10-CM

## 2017-12-29 DIAGNOSIS — Z5181 Encounter for therapeutic drug level monitoring: Secondary | ICD-10-CM

## 2017-12-29 DIAGNOSIS — M797 Fibromyalgia: Secondary | ICD-10-CM | POA: Diagnosis not present

## 2017-12-29 MED ORDER — GABAPENTIN 600 MG PO TABS: 600.0000 mg | ORAL_TABLET | Freq: Three times a day (TID) | ORAL | 3 refills | Status: DC

## 2017-12-29 NOTE — Progress Notes (Signed)
Reason for visit: Fibromyalgia, migraine  Referring physician: Dr. Maudie Mercury  Joy Taylor is a 22 y.o. female  History of present illness:  Joy Taylor is a 22 year old left-handed white female with an impressive list of medical problems at such a young age.  The patient has a history of Ehlers-Danlos syndrome, she claims that this was diagnosed through a Dietitian in [place].  The patient has a history of migraine headaches over the last 3 or 4 years, she has moved from [place] area to [place], she indicates that her neurologist started her on Depakote for her migraine which has been helpful.  The patient indicates that since she has started methotrexate for her lupus and rheumatoid arthritis her migraine headaches have become more frequent, occurring 2 or 3 times a week.  The headaches may be in the back of the head or around the eyes, not associated with nausea or vomiting, but the patient claims to have chronic nausea issues.  She is followed through a gastroenterologist for this.  The patient reports photophobia and phonophobia.  She indicates that stress will increase her headaches but she does not note any other activating factors.  She denies any family history of headache.  The patient claims that she was given the diagnosis of fibromyalgia 2 years ago with trigger points throughout the body including the neck, shoulders, back, hips, thighs.  The patient however also carries the diagnosis of rheumatoid arthritis and lupus, she is on Plaquenil and methotrexate.  She is followed through rheumatology at this time.  The patient has been treated with gabapentin currently on 300 mg 4 times daily with some benefit, but the fibromyalgia pain has worsened over the last 6 months to year.  In the past, the patient has been on Cymbalta but had cognitive side effects on this medication.  The patient reports memory problems as well.  She has word finding problems, she has chronic fatigue.   She does not sleep well at night.  The patient has difficulty recognizing faces of people that she knows.  She reports some numbness on the left side of the body.  She has a history of anxiety and depression.  She has irritable bowel syndrome as well, she has recently had arthroscopic surgery on her right knee.  Past Medical History:  Diagnosis Date  . Anxiety   . Depression   . Ehlers-Danlos disease   . Fibromyalgia   . Genital herpes    diagnosed by GYN-per patient no treatment advised unless daily flare ups occur  . GERD (gastroesophageal reflux disease)   . IBS (irritable bowel syndrome)   . Kidney stones   . Lupus   . Migraine   . Rheumatoid arthritis ([place])   . Stomach problems     Past Surgical History:  Procedure Laterality Date  . HIP ARTHROSCOPY Right 07/15/2016, 08/06/2015  . NISSEN FUNDOPLICATION  40/98/1191  . ROTATOR CUFF REPAIR Right 2010    Family History  Problem Relation Age of Onset  . Diabetes Mother   . Diverticulitis Mother   . Colon polyps Mother   . Macular degeneration Maternal Grandmother   . Heart disease Maternal Grandmother   . Hypertension Maternal Grandfather     Social history:  reports that  has never smoked. she has never used smokeless tobacco. She reports that she does not drink alcohol or use drugs.  Medications:  Prior to Admission medications   Medication Sig Start Date End Date Taking? Authorizing Provider  divalproex (DEPAKOTE) 500 MG DR tablet Take 1 tablet (500 mg total) by mouth 2 (two) times daily. 12/23/17  Yes Lucretia Kern, DO  Folic Acid 5 MG CAPS Take 1 capsule by mouth daily.   Yes [provider]  hydroxychloroquine (PLAQUENIL) 200 MG tablet Take 350 mg by mouth daily.   Yes [provider]  methotrexate (RHEUMATREX) 10 MG tablet Take 10 mg by mouth once a week. Caution: Chemotherapy. Protect from light.   Yes [provider]  Norethindrone Acetate-Ethinyl Estradiol (MICROGESTIN) 1.5-30 MG-MCG  tablet Take 1 tablet by mouth daily. 08/05/17  Yes Colin Benton R, DO  omeprazole (PRILOSEC) 40 MG capsule TAKE 1 CAPSULE BY MOUTH EVERY DAY 12/20/17  Yes Armbruster, Carlota Raspberry, MD  ondansetron (ZOFRAN) 4 MG tablet Take 4 mg by mouth 2 (two) times daily.   Yes [provider]  PILOCARPINE HCL PO Take 4 mg by mouth as needed.   Yes [provider]  Polyethylene Glycol 3350 (MIRALAX PO) Take by mouth daily.   Yes [provider]  gabapentin (NEURONTIN) 600 MG tablet Take 1 tablet (600 mg total) by mouth 3 (three) times daily. 12/29/17   Kathrynn Ducking, MD  SUMAtriptan (IMITREX) 6 MG/0.5ML SOLN injection Inject 0.5 mLs (6 mg total) into the skin once. May repeat in 2 hours if headache persists or recurs. 05/07/17 05/25/17  Lucretia Kern, DO     No Known Allergies  ROS:  Out of a complete 14 system review of symptoms, the patient complains only of the following symptoms, and all other reviewed systems are negative.  Fatigue Chest pains, swelling in the legs Ringing in the ears, dizziness, difficulty swallowing Itching Loss of vision, eye pain Cough Constipation Anemia Feeling hot, cold, increased thirst, flushing Joint pain, joint swelling, muscle cramps, aching muscles Skin sensitivity Memory loss, headache, dizziness Depression, anxiety, none of sleep, decreased energy Insomnia  Blood pressure 124/88, pulse 80, height 5' 4.5" (1.638 m), weight 142 lb 8 oz (64.6 kg).  Physical Exam  General: The patient is alert and cooperative at the time of the examination.  Eyes: Pupils are equal, round, and reactive to light. Discs are flat bilaterally.  Neck: The neck is supple, no carotid bruits are noted.  Respiratory: The respiratory examination is clear.  Cardiovascular: The cardiovascular examination reveals a regular rate and rhythm, no obvious murmurs or rubs are noted.  Neuromuscular: Range of movement the cervical spine is full.  No crepitus is noted in the  temporomandibular joints.  Skin: Extremities are without significant edema.  Neurologic Exam  Mental status: The patient is alert and oriented x 3 at the time of the examination. The patient has apparent normal recent and remote memory, with an apparently normal attention span and concentration ability.  Cranial nerves: Facial symmetry is present. There is good sensation of the face to pinprick and soft touch bilaterally. The strength of the facial muscles and the muscles to head turning and shoulder shrug are normal bilaterally. Speech is well enunciated, no aphasia or dysarthria is noted. Extraocular movements are full. Visual fields are full. The tongue is midline, and the patient has symmetric elevation of the soft palate. No obvious hearing deficits are noted.  Motor: The motor testing reveals 5 over 5 strength of all 4 extremities. Good symmetric motor tone is noted throughout.  Sensory: Sensory testing is intact to pinprick, soft touch, vibration sensation, and position sense on all 4 extremities, with exception some decreased pinprick and vibration  sensation on the left arm and leg as compared to the right. No evidence of extinction is noted.  Coordination: Cerebellar testing reveals good finger-nose-finger and heel-to-shin bilaterally.  Gait and station: Gait is normal. Tandem gait is normal. Romberg is negative. No drift is seen.  Reflexes: Deep tendon reflexes are symmetric and normal bilaterally. Toes are downgoing bilaterally.   Assessment/Plan:  1.  History of migraine headache  2.  Fibromyalgia versus lupus/rheumatoid arthritis  3.  Reports of memory disturbance  The patient has been given the diagnosis of fibromyalgia, but she also has a connective tissue disease such as rheumatoid arthritis and lupus.  She is followed through rheumatology.  Her neuromuscular discomfort has increased recently.  The migraine headaches have increased in frequency after addition of the  methotrexate.  The patient will be increased on gabapentin taking 600 mg 3 times daily.  The choice of Depakote for migraine is not optimal as this is a category X drug in pregnancy.  The patient is on folic acid.  She is also on methotrexate.  The patient will be sent for MRI of the brain given the reports of memory problems and left-sided numbness.  The patient will undergo blood work today to ensure that the ammonia level has not elevated while taking Depakote which could also cause cognitive dysfunction.  The patient will follow-up in 6 months.  Jill Alexanders MD 12/29/2017 9:32 AM  [facility] [address] [place], [place] [address]  Phone [phone] Fax [phone]

## 2017-12-29 NOTE — Patient Instructions (Signed)
   We will go up on the gabapentin to 600 mg three times a day.  We will check MRI of the brain.

## 2017-12-30 ENCOUNTER — Telehealth: Payer: Self-pay | Admitting: *Deleted

## 2017-12-30 LAB — VALPROIC ACID LEVEL: Valproic Acid Lvl: 95 ug/mL (ref 50–100)

## 2017-12-30 LAB — AMMONIA: Ammonia: 29 ug/dL (ref 19–87)

## 2017-12-30 NOTE — Telephone Encounter (Signed)
Attempted to reach patient re: lab results. Phone continuously rang. Unable to LVM.

## 2017-12-31 NOTE — Telephone Encounter (Signed)
Spoke with patient and informed her that her lab results are unremarkable. She then stated she is out of Depakote. She had a partial fill from her PCP. She is asking that it be refilled. This RN noted Dr Jannifer Franklin' office note re: Depakote in pregnancy. The patient stated she is taking BCP and is not pregnant at this time. Advised patient will route to Dr Jannifer Franklin to address her request for Depakote refill. Confirmed her pharmacy. She verbalized understanding, appreciation.

## 2018-01-02 MED ORDER — DIVALPROEX SODIUM 500 MG PO DR TAB: 500.0000 mg | DELAYED_RELEASE_TABLET | Freq: Two times a day (BID) | ORAL | 3 refills | Status: DC

## 2018-01-02 NOTE — Telephone Encounter (Signed)
The prescription for Depakote was given, we will need to consider a switch to another medication in the future.

## 2018-01-30 ENCOUNTER — Encounter (HOSPITAL_COMMUNITY): Payer: Self-pay | Admitting: Emergency Medicine

## 2018-01-30 ENCOUNTER — Emergency Department (HOSPITAL_COMMUNITY): Payer: BLUE CROSS/BLUE SHIELD

## 2018-01-30 ENCOUNTER — Emergency Department (HOSPITAL_COMMUNITY)
Admission: EM | Admit: 2018-01-30 | Discharge: 2018-01-30 | Disposition: A | Discharge status: Home / Self Care | Payer: BLUE CROSS/BLUE SHIELD | Attending: Emergency Medicine | Admitting: Emergency Medicine

## 2018-01-30 DIAGNOSIS — M79605 Pain in left leg: Secondary | ICD-10-CM | POA: Diagnosis not present

## 2018-01-30 DIAGNOSIS — M79604 Pain in right leg: Secondary | ICD-10-CM | POA: Diagnosis present

## 2018-01-30 DIAGNOSIS — Z79899 Other long term (current) drug therapy: Secondary | ICD-10-CM | POA: Diagnosis not present

## 2018-01-30 DIAGNOSIS — M5430 Sciatica, unspecified side: Secondary | ICD-10-CM

## 2018-01-30 DIAGNOSIS — M549 Dorsalgia, unspecified: Secondary | ICD-10-CM

## 2018-01-30 LAB — PREGNANCY, URINE: Preg Test, Ur: NEGATIVE

## 2018-01-30 LAB — POC URINE PREG, ED: Preg Test, Ur: NEGATIVE

## 2018-01-30 MED ORDER — KETOROLAC TROMETHAMINE 15 MG/ML IJ SOLN
15.0000 mg | Freq: Once | INTRAMUSCULAR | Status: DC
  Filled 2018-01-30: qty 1

## 2018-01-30 MED ORDER — ACETAMINOPHEN 325 MG PO TABS: 650.0000 mg | ORAL_TABLET | Freq: Four times a day (QID) | ORAL | 0 refills | Status: DC | PRN

## 2018-01-30 MED ORDER — KETOROLAC TROMETHAMINE 60 MG/2ML IM SOLN
15.0000 mg | Freq: Once | INTRAMUSCULAR | Status: AC
Start: 2018-01-30 — End: 2018-01-30
  Administered 2018-01-30: 15 mg via INTRAMUSCULAR

## 2018-01-30 NOTE — ED Provider Notes (Signed)
[facility] DEPT Provider Note   CSN: [id number] Arrival date & time: 01/30/18  1756     History   Chief Complaint Chief Complaint  Patient presents with  . Leg Pain    HPI Joy Taylor is a 22 y.o. female.  HPI   Pt is a 22 y/o female who a h/o fibromyalgia, ehlers-danlos disease, GERD, lupus, RA, who presents to the ED c/o pain to the upper part of bilat legs that began about 2 weeks ago. States that pain is a deep pain that has worsened since onset. Rates it 8/10. Pain is worse when walking or standing for too long. Has been taking Tylenol for her symptoms with no improvement.  States pain feels similar to pain she has had with fibromyalgia in the past.  Also reports pain to right mid back and right lower back pain that radiates to the right posterior leg is been ongoing for the same time period.  States she was seen by her rheumatologist on 3/15 and had blood work drawn by her rheumatologist, Dr. Isaias Sakai, who has not followed up with her you about her blood work, but did not start her on any medications for her symptoms.  Reports weakness to BLE. No difficulty ambulating. Pt denies any numbness/tingling to the BLE. Denies saddle anesthesia. Denies loss of control of bowels or bladder. No urinary retention. Denies a h/o IVDU. Denies a h/o CA or recent unintended weight loss.  No abdominal pain, nausea, vomiting, diarrhea, urinary or vaginal symptoms.  No flank pain.  No fevers.  No chest pain or shortness of breath.  States she was taken off her methotrexate 2 days ago.  Denies any recent falls or trauma.  Pt requesting IM toradol.   Past Medical History:  Diagnosis Date  . Anxiety   . Depression   . Ehlers-Danlos disease   . Fibromyalgia   . Genital herpes    diagnosed by GYN-per patient no treatment advised unless daily flare ups occur  . GERD (gastroesophageal reflux disease)   . IBS (irritable bowel syndrome)   . Kidney stones   . Lupus   .  Migraine   . Rheumatoid arthritis ([place])   . Stomach problems     Patient Active Problem List   Diagnosis Date Noted  . Ehlers-Danlos disease 04/19/2017  . Rheumatoid arthritis ([place]) 04/19/2017  . Systemic lupus erythematosus ([place]) 04/19/2017  . Fibromyalgia 04/19/2017  . Migraine 04/19/2017    Past Surgical History:  Procedure Laterality Date  . HIP ARTHROSCOPY Right 07/15/2016, 08/06/2015  . NISSEN FUNDOPLICATION  45/80/9983  . ROTATOR CUFF REPAIR Right 2010    OB History    No data available       Home Medications    Prior to Admission medications   Medication Sig Start Date End Date Taking? Authorizing Provider  divalproex (DEPAKOTE) 500 MG DR tablet Take 1 tablet (500 mg total) by mouth 2 (two) times daily. 01/02/18  Yes Kathrynn Ducking, MD  gabapentin (NEURONTIN) 600 MG tablet Take 1 tablet (600 mg total) by mouth 3 (three) times daily. 12/29/17  Yes Kathrynn Ducking, MD  hydroxychloroquine (PLAQUENIL) 200 MG tablet Take 350 mg by mouth daily at 10 pm.    Yes [provider]  Norethindrone Acetate-Ethinyl Estradiol (MICROGESTIN) 1.5-30 MG-MCG tablet Take 1 tablet by mouth daily. 08/05/17  Yes Colin Benton R, DO  omeprazole (PRILOSEC) 40 MG capsule TAKE 1 CAPSULE BY MOUTH EVERY DAY 12/20/17  Yes Armbruster, Carlota Raspberry,  MD  ondansetron (ZOFRAN) 4 MG tablet Take 4 mg by mouth every 8 (eight) hours as needed for nausea or vomiting.    Yes [provider]  pilocarpine (SALAGEN) 5 MG tablet Take 5 mg by mouth 4 (four) times daily as needed (for dry mouth).   Yes [provider]  acetaminophen (TYLENOL) 325 MG tablet Take 2 tablets (650 mg total) by mouth every 6 (six) hours as needed. Do not take more than 4000mg  of tylenol per day 01/30/18   Gurpreet Mariani S, PA-C  SUMAtriptan (IMITREX) 6 MG/0.5ML SOLN injection Inject 0.5 mLs (6 mg total) into the skin once. May repeat in 2 hours if headache persists or recurs. Patient not taking: Reported on 01/30/2018  05/07/17 05/25/17  Lucretia Kern, DO    Family History Family History  Problem Relation Age of Onset  . Diabetes Mother   . Diverticulitis Mother   . Colon polyps Mother   . Macular degeneration Maternal Grandmother   . Heart disease Maternal Grandmother   . Hypertension Maternal Grandfather     Social History Social History   Tobacco Use  . Smoking status: Never Smoker  . Smokeless tobacco: Never Used  Substance Use Topics  . Alcohol use: No  . Drug use: No     Allergies   Patient has no known allergies.   Review of Systems Review of Systems  Constitutional: Negative for fever.  HENT: Negative for ear pain.   Eyes: Negative for visual disturbance.  Respiratory: Negative for shortness of breath.   Cardiovascular: Negative for chest pain.  Gastrointestinal: Negative for abdominal pain, constipation, diarrhea, nausea and vomiting.  Genitourinary: Negative for decreased urine volume, dysuria, flank pain, hematuria and urgency.  Musculoskeletal: Positive for back pain. Negative for gait problem, neck pain and neck stiffness.       Bilat leg pain  Neurological: Positive for weakness. Negative for dizziness, light-headedness and numbness.     Physical Exam Updated Vital Signs BP 107/77   Pulse 87   Temp 98.2 F (36.8 C) (Oral)   Resp 16   LMP 01/09/2018   SpO2 97%   Physical Exam  Constitutional: She appears well-developed and well-nourished. No distress.  HENT:  Head: Normocephalic and atraumatic.  Eyes: Conjunctivae and EOM are normal. Pupils are equal, round, and reactive to light.  Neck: Neck supple.  Cardiovascular: Normal rate, regular rhythm and normal heart sounds.  No murmur heard. Pulmonary/Chest: Effort normal and breath sounds normal. No respiratory distress.  Abdominal: Soft. Bowel sounds are normal. She exhibits no distension. There is no tenderness.  No cva ttp  Musculoskeletal: She exhibits no edema.  No tenderness to the cervical or thoracic  spine.  Some tenderness to lumbar spine with associated right sided paraspinous tenderness.  Also with tenderness along right sciatic notch which reproduces her pain.  No tenderness to the bilateral thighs with palpation.  No obvious deformity.  Neurological: She is alert.  Motor:  Normal tone. 5/5 strength of BUE and BLE major muscle groups including strong and equal grip strength and dorsiflexion/plantar flexion Sensory: light touch normal in all extremities. DTRs: Patellar and achilles 2+ symmetric b/l Gait: normal gait and balance. Able to walk on toes and heels with ease.  CV: 2+ radial and DP/PT pulses   Skin: Skin is warm and dry. Capillary refill takes less than 2 seconds.  Psychiatric: She has a normal mood and affect.  Nursing note and vitals reviewed.    ED Treatments / Results  Labs (all labs ordered are listed, but only abnormal results are displayed) Labs Reviewed  PREGNANCY, URINE  POC URINE PREG, ED    EKG  EKG Interpretation None       Radiology Dg Lumbar Spine Complete  Result Date: 01/30/2018 CLINICAL DATA:  Lower back pain radiating to both legs x2 weeks. EXAM: LUMBAR SPINE - COMPLETE 4+ VIEW COMPARISON:  None. FINDINGS: There is no evidence of lumbar spine fracture. There are 5 non ribbed lumbar vertebrae in normal alignment. No suspicious osseous lesions are noted. No pars defects or listhesis. Intervertebral disc spaces are maintained. IMPRESSION: Negative. Electronically Signed   By: Ashley Royalty M.D.   On: 01/30/2018 21:04    Procedures Procedures (including critical care time)  Medications Ordered in ED Medications  ketorolac (TORADOL) injection 15 mg (15 mg Intramuscular Given 01/30/18 2009)     Initial Impression / Assessment and Plan / ED Course  I have reviewed the triage vital signs and the nursing notes.  Pertinent labs & imaging results that were available during my care of the patient were reviewed by me and considered in my medical  decision making (see chart for details).     Final Clinical Impressions(s) / ED Diagnoses   Final diagnoses:  Pain in both lower extremities  Sciatica, unspecified laterality  Back pain, unspecified back location, unspecified back pain laterality, unspecified chronicity   Patient with back pain and bilateral leg pain.  Also tenderness along the right sciatic notch.  Suspect back pain due to sciatica.   X-ray lumbar spine reveals no acute abnormalities or fractures. Bilateral leg pain with unclear etiology, most likely related to her fibromyalgia.  Do not suspect rhabdomyolysis or other emergent etiology of sxs. Strength and sensation intact. Brisk cap refil bilat and normal reflexes.  Patient in no acute distress and has normal vital signs.  Nontoxic. She is afebrile and is ambulatory.  Had labs drawn by rheumatologist 2 days ago and they are aware of this new symptoms, advised her to follow-up with rheumatologist and/or her neurologist for continued pain in this area.  Gave IM Toradol for symptoms.  Patient states pain is improved.  No neurological deficits and normal neuro exam.  Patient can walk but states is painful.  No loss of bowel or bladder control.  No concern for cauda equina.  No fever, night sweats, weight loss, h/o cancer, IVDU.  RICE protocol and pain medicine indicated and discussed with patient.  Patient agrees to contact her rheumatologist office tomorrow for follow-up.  Agrees to return to the emergency department for any new or worsening symptoms.  All questions were answered and patient understands the plan.  ED Discharge Orders        Ordered    acetaminophen (TYLENOL) 325 MG tablet  Every 6 hours PRN     01/30/18 2020       Rodney Booze, PA-C 01/30/18 2235    Drenda Freeze, MD 01/30/18 [phone]

## 2018-01-30 NOTE — ED Triage Notes (Signed)
Patient c/o lower back pain radiating to bilateral legs x2 weeks. Denies injury. Reports she was taken off methotrexate yesterday. Hx RA and fibromyalgia. Ambulatory.

## 2018-01-30 NOTE — Discharge Instructions (Signed)
You may take Tylenol or over-the-counter NSAIDs for your pain.  You should follow-up with your rheumatologist as well as her primary care doctor about your symptoms on Monday. Return to the emergency department immediately if you experience any back pain associated with fevers, loss of control of your bowels/bladder, weakness/numbness to your legs, numbness to your groin area, inability to walk, or inability to urinate.

## 2018-02-10 ENCOUNTER — Encounter: Payer: Self-pay | Admitting: Gastroenterology

## 2018-02-10 ENCOUNTER — Ambulatory Visit (INDEPENDENT_AMBULATORY_CARE_PROVIDER_SITE_OTHER): Payer: BLUE CROSS/BLUE SHIELD | Admitting: Gastroenterology

## 2018-02-10 ENCOUNTER — Encounter (INDEPENDENT_AMBULATORY_CARE_PROVIDER_SITE_OTHER): Payer: Self-pay

## 2018-02-10 VITALS — BP 94/50 | HR 76 | Ht 64.75 in | Wt 151.2 lb

## 2018-02-10 DIAGNOSIS — E162 Hypoglycemia, unspecified: Secondary | ICD-10-CM

## 2018-02-10 DIAGNOSIS — K3184 Gastroparesis: Secondary | ICD-10-CM | POA: Diagnosis not present

## 2018-02-10 DIAGNOSIS — K9289 Other specified diseases of the digestive system: Secondary | ICD-10-CM | POA: Diagnosis not present

## 2018-02-10 MED ORDER — METOCLOPRAMIDE HCL 5 MG PO TABS: 5.0000 mg | ORAL_TABLET | Freq: Three times a day (TID) | ORAL | 1 refills | Status: DC

## 2018-02-10 NOTE — Progress Notes (Signed)
HPI :  22 y/o female with a history of Ehlers'Danlos syndrome, RA / SLE, and history of reflux who had Nissen fundoplication in 06/5276, At that time she reported severe regurgitation of gastric contents refractory to medications, which led to this surgery in January, she states she had a large hiatal hernia with aspiration in the past from it as well.   When I first met her she had some upper abdominal complains, dysphagia, and intermittent chest pains, as well as some bloating / IBS symptoms.  She was placed on a trial of omeprazole as well as a low FODMAP diet.  She states this has not really helped her symptoms too much.  She continues to endorse a lot of bloating that bothers her after she eats.  She feels discomfort mostly in the epigastric upper abdomen area.  She states it comes and goes worse more recently.  She states that last several hours at a time even upwards of days.  She does endorse some periodic early satiety.  She is not having any nausea, no vomiting.  She states her bowels are relatively stable at this time, she is more so on the constipated side.  Having a bowel movement does not improve her symptoms.  Since she was last seen here we have received additional records that were sent over for her case.  She had a CT scan in April 2018 which was normal.  She did have a gastric emptying study prior to her Nissen fundoplication in October 8242 which showed mild gastroparesis.  She had an ultrasound of her right upper quadrant 2017 which was negative for gallstones.  She states she did have an upper endoscopy prior to her Nissen fundoplication which was normal. UGI series on 03/05/2017 which I reviewed - no abnormality reported, surgical site looked okay.   The patient otherwise states she thinks she is having episodes of low blood sugar.  She states she feels shaking/anxious and breaks out in a cold sweat.  She eats something and she states she feels better.  She also thinks she is gained  about 20 pounds over the past year.  She has purchased a glucometer for her house but has not used it so to clarify if she is truly having hypoglycemic episodes.  Prior workup.  CT abdomen without contrast 03/02/2017 - normal Gastric emptying study 09/01/16 - "mildly delayed" emptying [place] RUQ 05/12/2016 - normal    Past Medical History:  Diagnosis Date  . Anxiety   . Depression   . Ehlers-Danlos disease   . Fibromyalgia   . Genital herpes    diagnosed by GYN-per patient no treatment advised unless daily flare ups occur  . GERD (gastroesophageal reflux disease)   . IBS (irritable bowel syndrome)   . Kidney stones   . Lupus   . Migraine   . Rheumatoid arthritis ([place])   . Stomach problems      Past Surgical History:  Procedure Laterality Date  . HIP ARTHROSCOPY Right 07/15/2016, 08/06/2015  . KNEE ARTHROSCOPY Right   . NISSEN FUNDOPLICATION  35/36/1443  . ROTATOR CUFF REPAIR Right 2010   Family History  Problem Relation Age of Onset  . Diabetes Mother   . Diverticulitis Mother   . Colon polyps Mother   . Macular degeneration Maternal Grandmother   . Heart disease Maternal Grandmother   . Hypertension Maternal Grandfather   . Heart disease Maternal Grandfather   . Bone cancer Father   . Colon cancer Neg Hx   .  Esophageal cancer Neg Hx   . Pancreatic cancer Neg Hx   . Stomach cancer Neg Hx   . Liver disease Neg Hx    Social History   Tobacco Use  . Smoking status: Never Smoker  . Smokeless tobacco: Never Used  Substance Use Topics  . Alcohol use: No  . Drug use: No   Current Outpatient Medications  Medication Sig Dispense Refill  . acetaminophen (TYLENOL) 325 MG tablet Take 2 tablets (650 mg total) by mouth every 6 (six) hours as needed. Do not take more than 4063m of tylenol per day 30 tablet 0  . divalproex (DEPAKOTE) 500 MG DR tablet Take 1 tablet (500 mg total) by mouth 2 (two) times daily. 60 tablet 3  . gabapentin (NEURONTIN) 600 MG tablet Take 1 tablet  (600 mg total) by mouth 3 (three) times daily. 270 tablet 3  . hydroxychloroquine (PLAQUENIL) 200 MG tablet Take 350 mg by mouth daily at 10 pm.     . Norethindrone Acetate-Ethinyl Estradiol (MICROGESTIN) 1.5-30 MG-MCG tablet Take 1 tablet by mouth daily. 3 Package 3  . omeprazole (PRILOSEC) 40 MG capsule TAKE 1 CAPSULE BY MOUTH EVERY DAY 30 capsule 2  . ondansetron (ZOFRAN) 4 MG tablet Take 4 mg by mouth every 8 (eight) hours as needed for nausea or vomiting.     . pilocarpine (SALAGEN) 5 MG tablet Take 5 mg by mouth 4 (four) times daily as needed (for dry mouth).    . SUMAtriptan (IMITREX) 6 MG/0.5ML SOLN injection Inject 0.5 mLs (6 mg total) into the skin once. May repeat in 2 hours if headache persists or recurs. (Patient not taking: Reported on 01/30/2018) 0.5 mL 1   No current facility-administered medications for this visit.    No Known Allergies   Review of Systems: All systems reviewed and negative except where noted in HPI.   No results found for: WBC, HGB, HCT, MCV, PLT  No results found for: CREATININE, BUN, NA, K, CL, CO2   Physical Exam: BP (!) 94/50   Pulse 76   Ht 5' 4.75" (1.645 m)   Wt 151 lb 3.2 oz (68.6 kg)   BMI 25.36 kg/m  Constitutional: Pleasant,well-developed, female in no acute distress. HEENT: Normocephalic and atraumatic. Conjunctivae are normal. No scleral icterus. Neck supple.  Cardiovascular: Normal rate, regular rhythm.  Pulmonary/chest: Effort normal and breath sounds normal. No wheezing, rales or rhonchi. Abdominal: Soft, nondistended, nontender.  There are no masses palpable. No hepatomegaly. Extremities: no edema Lymphadenopathy: No cervical adenopathy noted. Neurological: Alert and oriented to person place and time. Skin: Skin is warm and dry. No rashes noted. Psychiatric: Normal mood and affect. Behavior is normal.   ASSESSMENT AND PLAN: 22year old female here for reassessment following issues:  Gas bloat syndrome / gastroparesis - I  suspect a lot of her postprandial abdominal discomfort and bloating is secondary to gas bloat syndrome following her fundoplication in the setting of mild gastroparesis noted on her gastric emptying study, after review of her records that were done prior to her operation.  It sounds like despite her mild gastroparesis, she had severe regurgitation and aspiration events which prompted her surgery. We discussed gas bloat syndrome / gastroparesis, and treatment options.  She has had no response to PPI so we can stop it.  I am recommending she use simethicone with each of her meals, and recommend a trial of Reglan 5 mg 3 times daily with meals as well.  I also recommended a gastroparesis diet which I  reviewed with her.  I like to see how she does over the next few weeks with this plan.  If her symptoms persist and continue to bother her I asked her to contact me, we may consider an EGD in this setting.  I counseled her on the risks of Reglan to include tardive dyskinesia, this side effect is extremely rare at dosing less than 30 mg/day. She agrees with the plan and trial of Reglan.  Suspected hypoglycemia - patient endorses symptoms concerning for hypoglycemic episodes, however she has not documented her blood sugar in association with these episodes.  Recommend she use her glucometer at home and check her blood sugar when she is feeling poorly.  If she is truly hypoglycemic, along with her weight gain, will need to be evaluated for insulinoma and refer to endocrine.  She agreed and will start keeping track of her blood sugars and contact me with these results.  Nazareth Cellar, MD [facility] Pager [phone]

## 2018-02-10 NOTE — Patient Instructions (Addendum)
If you are age 24 or older, your body mass index should be between 23-30. Your Body mass index is 25.36 kg/m. If this is out of the aforementioned range listed, please consider follow up with your Primary Care Provider.  If you are age 11 or younger, your body mass index should be between 19-25. Your Body mass index is 25.36 kg/m. If this is out of the aformentioned range listed, please consider follow up with your Primary Care Provider.   We are giving you a handout regarding following a Gastroparesis diet.  Please purchase Gas-X over-the-counter and take with each meal.  We have sent the following medications to your pharmacy for you to pick up at your convenience: Reglan 5mg : Take three times a day as needed  Please use your home glucometer to check your blood sugar when you are experiencing symptoms.  Discontinue taking omeprazole.  Thank you for entrusting me with your care and for choosing [facility], Dr. Crofton Cellar

## 2018-02-14 ENCOUNTER — Telehealth: Payer: Self-pay | Admitting: Gastroenterology

## 2018-02-14 NOTE — Telephone Encounter (Signed)
Left message for patient to call back, I need specific information as to what diagnosis and what kind of accommodations she needs for college.

## 2018-02-15 NOTE — Telephone Encounter (Signed)
Patient states she needs a letter for her college due to gastroparesis/gastric bloat syndrome. Pain from this sometimes causes her to not be able to attend classes.

## 2018-02-15 NOTE — Telephone Encounter (Signed)
Okay. We can provide her a general letter that she is cared for by our clinic and she has diagnosis that causes her to have abdominal discomfort at times, which may make her not be able to attend classes. Let me know if any further questions thanks

## 2018-02-16 ENCOUNTER — Ambulatory Visit: Payer: BLUE CROSS/BLUE SHIELD | Admitting: Neurology

## 2018-02-16 NOTE — Telephone Encounter (Signed)
Letter mailed to patient.

## 2018-03-03 ENCOUNTER — Encounter: Payer: Self-pay | Admitting: Family Medicine

## 2018-03-03 ENCOUNTER — Other Ambulatory Visit (HOSPITAL_COMMUNITY)
Admission: RE | Admit: 2018-03-03 | Discharge: 2018-03-03 | Disposition: A | Discharge status: Home / Self Care | Payer: BLUE CROSS/BLUE SHIELD | Source: Ambulatory Visit | Attending: Family Medicine | Admitting: Family Medicine

## 2018-03-03 ENCOUNTER — Ambulatory Visit (INDEPENDENT_AMBULATORY_CARE_PROVIDER_SITE_OTHER): Payer: BLUE CROSS/BLUE SHIELD | Admitting: Family Medicine

## 2018-03-03 VITALS — BP 100/68 | HR 75 | Temp 98.4°F | Ht 64.75 in | Wt 151.2 lb

## 2018-03-03 DIAGNOSIS — Z113 Encounter for screening for infections with a predominantly sexual mode of transmission: Secondary | ICD-10-CM | POA: Diagnosis not present

## 2018-03-03 DIAGNOSIS — K3184 Gastroparesis: Secondary | ICD-10-CM

## 2018-03-03 DIAGNOSIS — B009 Herpesviral infection, unspecified: Secondary | ICD-10-CM

## 2018-03-03 NOTE — Addendum Note (Signed)
Addended by: Agnes Lawrence on: 03/03/2018 02:10 PM   Modules accepted: Orders

## 2018-03-03 NOTE — Progress Notes (Signed)
HPI:  Using dictation device. Unfortunately this device frequently misinterprets words/phrases.  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  Past Medical History:  Diagnosis Date  . Anxiety   . Depression   . Ehlers-Danlos disease   . Fibromyalgia   . Genital herpes    diagnosed by GYN-per patient no treatment advised unless daily flare ups occur  . GERD (gastroesophageal reflux disease)   . IBS (irritable bowel syndrome)   . Kidney stones   . Lupus ([place])   . Migraine   . Rheumatoid arthritis ([place])   . Stomach problems     Past Surgical History:  Procedure Laterality Date  . HIP ARTHROSCOPY Right 07/15/2016, 08/06/2015  . KNEE ARTHROSCOPY Right   . NISSEN FUNDOPLICATION  16/60/6301  . ROTATOR CUFF REPAIR Right 2010    Family History  Problem Relation Age of Onset  . Diabetes Mother   . Diverticulitis Mother   . Colon polyps Mother   . Macular degeneration Maternal Grandmother   . Heart disease Maternal Grandmother   . Hypertension Maternal Grandfather   . Heart disease Maternal Grandfather   . Bone cancer Father   . Colon cancer Neg Hx   . Esophageal cancer Neg Hx   . Pancreatic cancer Neg Hx   . Stomach cancer Neg Hx   . Liver disease Neg Hx     SOCIAL HX: see above, hpi   Current Outpatient Medications:  .  acetaminophen (TYLENOL) 325 MG tablet, Take 2 tablets (650 mg total) by mouth every 6 (six) hours as needed. Do not take more than 4000mg  of tylenol per day, Disp: 30 tablet, Rfl: 0 .  divalproex (DEPAKOTE) 500 MG  DR tablet, Take 1 tablet (500 mg total) by mouth 2 (two) times daily., Disp: 60 tablet, Rfl: 3 .  gabapentin (NEURONTIN) 600 MG tablet, Take 1 tablet (600 mg total) by mouth 3 (three) times daily., Disp: 270 tablet, Rfl: 3 .  hydroxychloroquine (PLAQUENIL) 200 MG tablet, Take 350 mg by mouth daily at 10 pm. , Disp: , Rfl:  .  metoCLOPramide (REGLAN) 5 MG tablet, Take 1 tablet (5 mg total) by mouth 3 (three) times daily before meals., Disp: 90 tablet, Rfl: 1 .  Norethindrone Acetate-Ethinyl Estradiol (MICROGESTIN) 1.5-30 MG-MCG tablet, Take 1 tablet by mouth daily., Disp: 3 Package, Rfl: 3 .  ondansetron (ZOFRAN) 4 MG tablet, Take 4 mg by mouth every 8 (eight) hours as needed for nausea or vomiting. , Disp: , Rfl:  .  pilocarpine (SALAGEN) 5 MG tablet, Take 5 mg by mouth 4 (four) times daily as needed (for dry mouth)., Disp: , Rfl:  .  simethicone (MYLICON) 80 MG chewable tablet, Chew 80 mg by mouth 3 (three) times daily with meals., Disp: , Rfl:  .  SUMAtriptan (IMITREX) 6 MG/0.5ML SOLN injection, Inject 0.5 mLs (6 mg total) into the skin once. May repeat in 2 hours if headache persists or recurs. (Patient not taking: Reported on 01/30/2018), Disp: 0.5 mL, Rfl: 1  EXAM:  Vitals:   03/03/18 1347  BP:  100/68  Pulse: 75  Temp: 98.4 F (36.9 C)    Body mass index is 25.36 kg/m.  GENERAL: vitals reviewed and listed above, alert, oriented, appears well hydrated and in no acute distress  HEENT: atraumatic, conjunttiva clear, no obvious abnormalities on inspection of external nose and ears  NECK: no obvious masses on inspection  GU: normal ext genitalia, no lesions, normal vaginal vault, sm amount white thick discharge, cervix normal   MS: moves all extremities without noticeable abnormality  PSYCH: pleasant and cooperative, no obvious depression or anxiety  ASSESSMENT AND PLAN:  Discussed the following assessment and plan:  Routine screening for STI (sexually transmitted  infection)  Herpes  Gastroparesis  -labs per orders -discuss episodic vs suppressive therapy for hsv -I am not familiar with nutritionist especially skilled in the management of GP, advised her gastroenterologist may be better able to answer this question -safe sex education provided -Patient advised to return or notify a doctor immediately if symptoms worsen or persist or new concerns arise.  There are no Patient Instructions on file for this visit.  Lucretia Kern, DO

## 2018-03-03 NOTE — Patient Instructions (Addendum)
BEFORE YOU LEAVE: -lab  We have ordered labs or studies at this visit. It can take up to 1-2 weeks for results and processing. IF results require follow up or explanation, we will call you with instructions. Clinically stable results will be released to your [facility]. If you have not heard from [place] or cannot find your results in [facility] in 2 weeks please contact our office at [phone].  If you are not yet signed up for [facility], please consider signing up.  Follow up with your gastroenterologist regarding nutrition referral for gastroparesis.    Safe Sex Practicing safe sex means taking steps before and during sex to reduce your risk of:  Getting an STD (sexually transmitted disease).  Giving your partner an STD.  Unwanted pregnancy.  How can I practice safe sex?  To practice safe sex:  Limit your sexual partners to only one partner who is having sex with only you.  Avoid using alcohol and recreational drugs before having sex. These substances can affect your judgment.  Before having sex with a new partner: ? Talk to your partner about past partners, past STDs, and drug use. ? You and your partner should be screened for STDs and discuss the results with each other.  Check your body regularly for sores, blisters, rashes, or unusual discharge. If you notice any of these problems, visit your health care provider.  If you have symptoms of an infection or you are being treated for an STD, avoid sexual contact.  While having sex, use a condom. Make sure to: ? Use a condom every time you have vaginal, oral, or anal sex. Both females and males should wear condoms during oral sex. ? Keep condoms in place from the beginning to the end of sexual activity. ? Use a latex condom, if possible. Latex condoms offer the best protection. ? Use only water-based lubricants or oils to lubricate a condom. Using petroleum-based lubricants or oils will weaken the condom and increase the chance that it  will break.  See your health care provider for regular screenings, exams, and tests for STDs.  Talk with your health care provider about the form of birth control (contraception) that is best for you.  Get vaccinated against hepatitis B and human papillomavirus (HPV).  If you are at risk of being infected with HIV (human immunodeficiency virus), talk with your health care provider about taking a prescription medicine to prevent HIV infection. You are considered at risk for HIV if: ? You are a man who has sex with other men. ? You are a heterosexual man or woman who is sexually active with more than one partner. ? You take drugs by injection. ? You are sexually active with a partner who has HIV.  This information is not intended to replace advice given to you by your health care provider. Make sure you discuss any questions you have with your health care provider. Document Released: 12/10/2004 Document Revised: 03/18/2016 Document Reviewed: 09/22/2015 Elsevier Interactive Patient Education  [employer].

## 2018-03-04 LAB — CERVICOVAGINAL ANCILLARY ONLY
Bacterial vaginitis: POSITIVE — AB
Candida vaginitis: NEGATIVE
Chlamydia: NEGATIVE
Neisseria Gonorrhea: NEGATIVE
Trichomonas: NEGATIVE

## 2018-03-04 LAB — HSV(HERPES SIMPLEX VRS) I + II AB-IGG
HAV 1 IGG,TYPE SPECIFIC AB: <0.9 index
HSV 2 IGG,TYPE SPECIFIC AB: 8.35 index — ABNORMAL HIGH

## 2018-03-04 LAB — HIV ANTIBODY (ROUTINE TESTING W REFLEX): HIV 1&2 Ab, 4th Generation: NONREACTIVE

## 2018-03-07 ENCOUNTER — Telehealth: Payer: Self-pay | Admitting: Family Medicine

## 2018-03-07 NOTE — Telephone Encounter (Signed)
Copied from [place] [id number]. Topic: Quick Communication - Lab Results >> Mar 07, 2018  1:11 PM Agnes Lawrence, CMA wrote: Called patient to inform them of lab results. When patient returns call, triage nurse may disclose results.

## 2018-03-08 ENCOUNTER — Telehealth: Payer: Self-pay | Admitting: Gastroenterology

## 2018-03-08 MED ORDER — METRONIDAZOLE 0.75 % VA GEL: 1.0000 | Freq: Every day | VAGINAL | 0 refills | Status: AC

## 2018-03-08 NOTE — Telephone Encounter (Signed)
Routed to Dr. Armbruster. 

## 2018-03-08 NOTE — Progress Notes (Signed)
Sending metro vag to pharmacy. Please confirm she has rx for episodic tx herpes..please confirm she has rx for episodic tx herpes or does she need [place] to send as well?

## 2018-03-08 NOTE — Addendum Note (Signed)
Addended by: Agnes Lawrence on: 03/08/2018 03:50 PM   Modules accepted: Orders

## 2018-03-08 NOTE — Telephone Encounter (Signed)
If she is not feeling well or that it is not working, I think EGD may be a reasonable next step if she wishes to proceed. Thanks

## 2018-03-09 NOTE — Telephone Encounter (Signed)
Spoke to patient, she is agreeable to proceeding with EGD. Scheduled PV on 4/29 and EGD on 5/3.

## 2018-03-14 ENCOUNTER — Ambulatory Visit (AMBULATORY_SURGERY_CENTER): Payer: Self-pay | Admitting: *Deleted

## 2018-03-14 ENCOUNTER — Other Ambulatory Visit: Payer: Self-pay

## 2018-03-14 VITALS — Ht 64.5 in | Wt 153.0 lb

## 2018-03-14 DIAGNOSIS — R1084 Generalized abdominal pain: Secondary | ICD-10-CM

## 2018-03-14 NOTE — Progress Notes (Signed)
Patient denies any allergies to eggs or soy. Patient denies any problems with anesthesia/sedation. Patient denies any oxygen use at home. Patient denies taking any diet/weight loss medications or blood thinners. EMMI education assisgned to patient on EGD, this was explained and instructions given to patient. 

## 2018-03-16 ENCOUNTER — Other Ambulatory Visit: Payer: Self-pay | Admitting: Orthopedic Surgery

## 2018-03-18 ENCOUNTER — Encounter: Payer: Self-pay | Admitting: Gastroenterology

## 2018-03-18 ENCOUNTER — Other Ambulatory Visit: Payer: Self-pay

## 2018-03-18 ENCOUNTER — Other Ambulatory Visit: Payer: Self-pay | Admitting: Orthopedic Surgery

## 2018-03-18 ENCOUNTER — Ambulatory Visit (AMBULATORY_SURGERY_CENTER): Payer: BLUE CROSS/BLUE SHIELD | Admitting: Gastroenterology

## 2018-03-18 VITALS — BP 109/68 | HR 58 | Temp 98.9°F | Resp 18 | Ht 64.0 in | Wt 153.0 lb

## 2018-03-18 DIAGNOSIS — R1013 Epigastric pain: Secondary | ICD-10-CM | POA: Diagnosis present

## 2018-03-18 DIAGNOSIS — Z9889 Other specified postprocedural states: Secondary | ICD-10-CM

## 2018-03-18 DIAGNOSIS — M25511 Pain in right shoulder: Secondary | ICD-10-CM

## 2018-03-18 DIAGNOSIS — R531 Weakness: Secondary | ICD-10-CM

## 2018-03-18 MED ORDER — SODIUM CHLORIDE 0.9 % IV SOLN: 500.0000 mL | Freq: Once | INTRAVENOUS | Status: DC

## 2018-03-18 NOTE — Progress Notes (Signed)
Called to room to assist during endoscopic procedure.  Patient ID and intended procedure confirmed with present staff. Received instructions for my participation in the procedure from the performing physician.  

## 2018-03-18 NOTE — Progress Notes (Signed)
A and O x3. Report to RN. Tolerated MAC anesthesia well.

## 2018-03-18 NOTE — Op Note (Signed)
[place] Patient Name: [facility] Procedure Date: 03/18/2018 4:34 PM MRN: [id number] Endoscopist: Remo Lipps P. 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 The patient's tolerance of                            previous anesthesia was also reviewed. The risks                            and benefits of the procedure and the sedation                            options and risks were discussed with the patient.                            All questions were answered, and informed consent                            was obtained. Prior Anticoagulants: The patient has                            taken no previous anticoagulant or antiplatelet                            agents. ASA Grade Assessment: II - A patient with                            mild systemic disease. After reviewing the risks                            and benefits, the patient was deemed in  satisfactory condition to undergo the procedure.                           After obtaining informed consent, the endoscope was                            passed under direct vision. Throughout the                            procedure, the patient's blood pressure, pulse, and                            oxygen saturations were monitored continuously. The                            Model GIF-HQ190  [id number]) scope was introduced                            through the mouth, and advanced to the second part                            of duodenum. The upper GI endoscopy was                            accomplished without difficulty. The patient                            tolerated the procedure well. Scope In: Scope Out: Findings:                 Esophagogastric landmarks were identified: the                            Z-line was found at 35 cm and the gastroesophageal                            junction was found at 35 cm from the incisors.                           The exam of the esophagus was otherwise normal.                           Evidence of a Nissen fundoplication was found in                            the cardia. The wrap appeared intact.                           The exam of the stomach was otherwise normal.                           Biopsies were taken with a cold forceps in the  gastric body, at the incisura and in the gastric                            antrum for Helicobacter pylori testing.                           The duodenal bulb and second portion of the                            duodenum were normal. Complications:            No immediate complications. Estimated blood loss:                            Minimal. Estimated Blood Loss:     Estimated blood loss was minimal. Impression:               - Esophagogastric landmarks identified.                           - Normal esophagus                           - A Nissen fundoplication was found. The wrap                            appears intact.                           - Normal stomach otherwise - biopsies taken to rule                            out H pylori                           - Normal duodenal bulb and second portion of the                            duodenum.                           Overall, no overt pathology to explain the                            patient's symptoms.  Ruling out H pylori. 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 Recommendation:           - Patient has a contact number available for                            emergencies. The signs and symptoms of potential  delayed complications were discussed with the                            patient. Return to normal activities tomorrow.                            Written discharge instructions were provided to the                            patient.                           - Resume previous diet.                           - Continue present medications.                           - Await pathology results.                           - Consideration for trial of domperidone versus                            repeating gastric emptying study to reassess                            gastric functioning prior to proceeding with trial                            of domperidone, will discuss with patient Carlota Raspberry. Rosaria Kubin MD, MD 03/18/2018 4:50:38 PM This report has been signed electronically.

## 2018-03-18 NOTE — Patient Instructions (Signed)
YOU HAD AN ENDOSCOPIC PROCEDURE TODAY AT THE [facility]:   Refer to the procedure report that was given to you for any specific questions about what was found during the examination.  If the procedure report does not answer your questions, please call your gastroenterologist to clarify.  If you requested that your care partner not be given the details of your procedure findings, then the procedure report has been included in a sealed envelope for you to review at your convenience later.  YOU SHOULD EXPECT: Some feelings of bloating in the abdomen. Passage of more gas than usual.  Walking can help get rid of the air that was put into your GI tract during the procedure and reduce the bloating. If you had a lower endoscopy (such as a colonoscopy or flexible sigmoidoscopy) you may notice spotting of blood in your stool or on the toilet paper. If you underwent a bowel prep for your procedure, you may not have a normal bowel movement for a few days.  Please Note:  You might notice some irritation and congestion in your nose or some drainage.  This is from the oxygen used during your procedure.  There is no need for concern and it should clear up in a day or so.  SYMPTOMS TO REPORT IMMEDIATELY:   Following upper endoscopy (EGD)  Vomiting of blood or coffee ground material  New chest pain or pain under the shoulder blades  Painful or persistently difficult swallowing  New shortness of breath  Fever of 100F or higher  Black, tarry-looking stools  For urgent or emergent issues, a gastroenterologist can be reached at any hour by calling [phone].   DIET:  We do recommend a small meal at first, but then you may proceed to your regular diet.  Drink plenty of fluids but you should avoid alcoholic beverages for 24 hours.  ACTIVITY:  You should plan to take it easy for the rest of today and you should NOT DRIVE or use heavy machinery until tomorrow (because of the sedation medicines used  during the test).    FOLLOW UP: Our staff will call the number listed on your records the next business day following your procedure to check on you and address any questions or concerns that you may have regarding the information given to you following your procedure. If we do not reach you, we will leave a message.  However, if you are feeling well and you are not experiencing any problems, there is no need to return our call.  We will assume that you have returned to your regular daily activities without incident.  If any biopsies were taken you will be contacted by phone or by letter within the next 1-3 weeks.  Please call us at [phone] if you have not heard about the biopsies in 3 weeks.    SIGNATURES/CONFIDENTIALITY: You and/or your care partner have signed paperwork which will be entered into your electronic medical record.  These signatures attest to the fact that that the information above on your After Visit Summary has been reviewed and is understood.  Full responsibility of the confidentiality of this discharge information lies with you and/or your care-partner. 

## 2018-03-18 NOTE — Progress Notes (Signed)
Teeth unchanged after procedure. 

## 2018-03-18 NOTE — Progress Notes (Signed)
No changes in medical or surgical hx since PV per pt 

## 2018-03-21 ENCOUNTER — Telehealth: Payer: Self-pay | Admitting: *Deleted

## 2018-03-21 NOTE — Telephone Encounter (Signed)
  Follow up Call-  Call back number 03/18/2018  Post procedure Call Back phone  # [phone]  Permission to leave phone message Yes  Some recent data might be hidden     Patient questions:  Do you have a fever, pain , or abdominal swelling? No. Pain Score  0 *  Have you tolerated food without any problems? Yes.    Have you been able to return to your normal activities? Yes.    Do you have any questions about your discharge instructions: Diet   No. Medications  No. Follow up visit  No.  Do you have questions or concerns about your Care? No.  Actions: * If pain score is 4 or above: No action needed, pain <4.

## 2018-03-24 ENCOUNTER — Other Ambulatory Visit: Payer: Self-pay | Admitting: Family Medicine

## 2018-03-24 NOTE — Telephone Encounter (Signed)
Rx denial sent to the pharmacy. 

## 2018-03-24 NOTE — Telephone Encounter (Signed)
Sent 1 year of refills at last rx in September, dispense 3 with 3 refills. Please check with pharmacy why new rx being requested?

## 2018-03-25 ENCOUNTER — Other Ambulatory Visit: Payer: Self-pay

## 2018-03-25 DIAGNOSIS — K9289 Other specified diseases of the digestive system: Secondary | ICD-10-CM

## 2018-03-25 DIAGNOSIS — Z9889 Other specified postprocedural states: Secondary | ICD-10-CM

## 2018-03-25 DIAGNOSIS — R1013 Epigastric pain: Secondary | ICD-10-CM

## 2018-03-25 DIAGNOSIS — K3184 Gastroparesis: Secondary | ICD-10-CM

## 2018-03-30 ENCOUNTER — Ambulatory Visit
Admission: RE | Admit: 2018-03-30 | Discharge: 2018-03-30 | Disposition: A | Discharge status: Home / Self Care | Payer: BLUE CROSS/BLUE SHIELD | Source: Ambulatory Visit | Attending: Orthopedic Surgery | Admitting: Orthopedic Surgery

## 2018-03-30 DIAGNOSIS — R531 Weakness: Secondary | ICD-10-CM

## 2018-03-30 DIAGNOSIS — M25511 Pain in right shoulder: Secondary | ICD-10-CM

## 2018-03-30 MED ORDER — IOPAMIDOL (ISOVUE-M 200) INJECTION 41%
12.0000 mL | Freq: Once | INTRAMUSCULAR | Status: AC
  Administered 2018-03-30: 12 mL via INTRA_ARTICULAR

## 2018-04-04 ENCOUNTER — Telehealth: Payer: Self-pay | Admitting: Family Medicine

## 2018-04-04 NOTE — Telephone Encounter (Signed)
Copied from [place] [id number]. Topic: Quick Communication - Rx Refill/Question >> Apr 04, 2018  1:52 PM Leonides Schanz, Ja-Kwan wrote: Medication: Norethindrone Acetate-Ethinyl Estradiol (MICROGESTIN) 1.5-30 MG-MCG tablet   Preferred Pharmacy (with phone number or street name): [id number] - [place], [address] [phone] (Phone) [phone] (Fax)   Agent: Please be advised that RX refills may take up to 3 business days. We ask that you follow-up with your pharmacy.

## 2018-04-05 MED ORDER — NORETHINDRONE ACET-ETHINYL EST 1.5-30 MG-MCG PO TABS: 1.0000 | ORAL_TABLET | Freq: Every day | ORAL | 3 refills | Status: DC

## 2018-04-14 ENCOUNTER — Encounter (HOSPITAL_COMMUNITY)
Admission: RE | Admit: 2018-04-14 | Discharge: 2018-04-14 | Disposition: A | Discharge status: Home / Self Care | Payer: BLUE CROSS/BLUE SHIELD | Source: Ambulatory Visit | Attending: Gastroenterology | Admitting: Gastroenterology

## 2018-04-14 ENCOUNTER — Other Ambulatory Visit: Payer: Self-pay

## 2018-04-14 DIAGNOSIS — R1013 Epigastric pain: Secondary | ICD-10-CM | POA: Diagnosis present

## 2018-04-14 DIAGNOSIS — K9289 Other specified diseases of the digestive system: Secondary | ICD-10-CM | POA: Insufficient documentation

## 2018-04-14 DIAGNOSIS — K3184 Gastroparesis: Secondary | ICD-10-CM | POA: Diagnosis present

## 2018-04-14 DIAGNOSIS — Z9889 Other specified postprocedural states: Secondary | ICD-10-CM | POA: Diagnosis present

## 2018-04-14 MED ORDER — TECHNETIUM TC 99M SULFUR COLLOID FILTERED: 2.0000 | Freq: Once | INTRAVENOUS | Status: DC | PRN

## 2018-04-14 MED ORDER — BUSPIRONE HCL 15 MG PO TABS: 15.0000 mg | ORAL_TABLET | ORAL | 1 refills | Status: DC

## 2018-04-14 MED ORDER — TECHNETIUM TC 99M SULFUR COLLOID
2.0000 | Freq: Once | INTRAVENOUS | Status: AC | PRN
  Administered 2018-04-14: 2 via INTRAVENOUS

## 2018-05-02 ENCOUNTER — Other Ambulatory Visit: Payer: Self-pay | Admitting: Neurology

## 2018-05-30 ENCOUNTER — Telehealth: Payer: Self-pay | Admitting: Gastroenterology

## 2018-05-30 NOTE — Telephone Encounter (Signed)
Left a message to return call.  

## 2018-05-30 NOTE — Telephone Encounter (Signed)
Pt returned call. She called as directed at her last office visit. She wanted to give an update on her Buspar. She states she is using Buspar 15 mg one a day. This has helped prevent her stomach from feeling like she has bricks inside, and she can tell it has helped her stomach to relax. But, it has caused terrible nausea. She never increased the dose to BID. She has also had an increase of GERD. She has started coughing up acid again.

## 2018-05-30 NOTE — Telephone Encounter (Signed)
Patient states she has questions about medication buspirone. Dr.Armbruster pt.

## 2018-05-31 ENCOUNTER — Other Ambulatory Visit: Payer: Self-pay

## 2018-05-31 MED ORDER — RANITIDINE HCL 150 MG PO TABS: 150.0000 mg | ORAL_TABLET | Freq: Two times a day (BID) | ORAL | 2 refills | Status: DC

## 2018-05-31 MED ORDER — ONDANSETRON 4 MG PO TBDP: 4.0000 mg | ORAL_TABLET | Freq: Three times a day (TID) | ORAL | 3 refills | Status: DC | PRN

## 2018-05-31 NOTE — Telephone Encounter (Signed)
Thanks for the update Vivien Rota, If she's having nausea, can we refill her zofran to use 4mg  every 8 hours standing, to see if that helps. Can give #60 [id number]. If she's not taking anything for reflux, can take zantac 150mg  once to twice daily to start and see if that helps. If she wants something stronger than zantac would give omeprazole 40mg  once daily. She should continue buspirone otherwise if that is helping her other symptoms. Thanks

## 2018-05-31 NOTE — Telephone Encounter (Signed)
Patient calling back regarding this. Best # [phone]

## 2018-05-31 NOTE — Telephone Encounter (Signed)
Okay that sounds fine. Thanks Jan

## 2018-05-31 NOTE — Telephone Encounter (Signed)
Spoke to pt. Relayed recommendations.  She asked for Zofran ODT. She will also try the Zantac 150mg  BID but said that Omeprazole hasn't helped that much in the past. She wasn't sure she should increase buspirone to BID until after she has gotten the nausea under control. She is very concerned that something might be wrong with the Nissen fundoplication procedure she had because the acid is so bad. She will try the Zofran and Ranitidine for a week and let us know if her symptoms improve.

## 2018-06-23 ENCOUNTER — Other Ambulatory Visit: Payer: Self-pay | Admitting: Gastroenterology

## 2018-06-29 HISTORY — PX: SHOULDER ARTHROSCOPY: SHX128

## 2018-06-30 ENCOUNTER — Ambulatory Visit: Payer: Non-veteran care | Admitting: Adult Health

## 2018-07-12 ENCOUNTER — Ambulatory Visit (INDEPENDENT_AMBULATORY_CARE_PROVIDER_SITE_OTHER): Payer: BLUE CROSS/BLUE SHIELD | Admitting: Physician Assistant

## 2018-07-12 ENCOUNTER — Encounter: Payer: Self-pay | Admitting: Physician Assistant

## 2018-07-12 ENCOUNTER — Telehealth: Payer: Self-pay | Admitting: Gastroenterology

## 2018-07-12 VITALS — BP 104/68 | HR 94 | Ht 65.0 in | Wt 157.2 lb

## 2018-07-12 DIAGNOSIS — R1010 Upper abdominal pain, unspecified: Secondary | ICD-10-CM | POA: Diagnosis not present

## 2018-07-12 DIAGNOSIS — R1013 Epigastric pain: Secondary | ICD-10-CM

## 2018-07-12 DIAGNOSIS — K3184 Gastroparesis: Secondary | ICD-10-CM

## 2018-07-12 MED ORDER — HYDROCORTISONE 1 % EX OINT: 1.0000 "application " | TOPICAL_OINTMENT | Freq: Two times a day (BID) | CUTANEOUS | 0 refills | Status: DC

## 2018-07-12 NOTE — Progress Notes (Signed)
Chief Complaint: "Excruciating abdominal pain"  HPI:    [name] is a 22 year old female with a past medical history as listed below including IBS, follows with Dr. Havery Moros and presents to clinic today as an urgent fit in appointment for excruciating abdominal pain.    02/10/2018 office visit with Dr. Havery Moros and it was discussed during first visit last year she discussed dysphasia intermittent chest pains and some bloating and IBS symptoms.  She is placed on a trial of omeprazole as well as a low FODMAP diet which was not helping.  She continued to endorse bloating.  Apparently had a CT scan in April 2018 which was normal as well as a gastric emptying study prior to her Corky Downs fundoplication October 8563 which showed mild gastroparesis.  Had an ultrasound of her right upper quadrant 2017 which was negative for gallstones.  She had an upper endoscopy prior to her Niesen fundoplication which was normal.  Upper GI series on 03/05/2017 which was normal.  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  Was recommended she use simethicone with each of her meals and a trial of Reglan 5 mg 3 times daily with meals was also given.  She was started on gastroparesis diet.  It was discussed that if her symptoms persisted and continued it was recommended she have an EGD.  She was also suspected to have hypoglycemia.    Today, presents to clinic and explains that on 06/29/18 she had shoulder surgery and since then had been using Oxycodone until 07/07/2018 and notes that this has caused a "flare of all of my stomach symptoms".  She has continued her BuSpar using 15 mg at night because this makes her sleepy.  Tells me that she has epigastric pain which radiates across her abdomen and causes bloating and abdominal distention which leaves her laying  on the couch.  Describes this pain as an 8/10, sometimes worse at times.  Most recently she has cut out meat and dairy in her diet and this is actually helping with some of her symptoms, she is making sure to thoroughly cooked vegetables.  Does note a decrease in bowel movements with one every other day and they are not always that big.  Patient is no longer using her Zantac as prescribed or her MiraLAX as she was trying to minimize the medication she was on.    Also complains of some hemorrhoid pain and asked for "cream" today.    Denies fever, chills, weight loss or symptoms that awaken her from sleep.  Past Medical History:  Diagnosis Date  . Anxiety   . Depression   . Ehlers-Danlos disease   . Fibromyalgia   . Genital herpes    diagnosed by GYN-per patient no treatment advised unless daily flare ups occur  . GERD (gastroesophageal reflux disease)   . IBS (irritable bowel syndrome)   . Kidney stones   . Low BP    post-op  . Lupus ([place])   . Migraine   . Rheumatoid arthritis ([place])   . Stomach problems     Past Surgical History:  Procedure Laterality Date  . HIP ARTHROSCOPY Right 07/15/2016, 08/06/2015  . KNEE ARTHROSCOPY Right   . NISSEN FUNDOPLICATION  14/97/0263  . ROTATOR CUFF REPAIR Right 2010  . SHOULDER ARTHROSCOPY Right 06/29/2018    Current Outpatient Medications  Medication Sig Dispense Refill  . acetaminophen (TYLENOL) 325 MG tablet Take 2 tablets (650 mg total) by mouth every 6 (six) hours as needed. Do not take more than 4000mg  of tylenol per day 30 tablet 0  . AMBULATORY NON FORMULARY MEDICATION Take 1 tablet by mouth as needed. oxycotin 5mg     . aspirin-acetaminophen-caffeine (EXCEDRIN MIGRAINE) 250-250-65 MG tablet Take 1 tablet by mouth every 6 (six) hours as needed for headache.    . busPIRone (BUSPAR) 15 MG tablet Take 1 tablet (15 mg total) by mouth 2 (two) times daily. 60 tablet 2  . divalproex (DEPAKOTE) 500 MG DR tablet TAKE 1 TABLET BY MOUTH TWICE A DAY 60  tablet 3  . fluticasone (FLONASE) 50 MCG/ACT nasal spray Place 1 spray into both nostrils daily.    Marland Kitchen gabapentin (NEURONTIN) 600 MG tablet Take 1 tablet (600 mg total) by mouth 3 (three) times daily. 270 tablet 3  . Ibuprofen (IBU PO) Take 3 tablets by mouth 2 (two) times daily.    . Multiple Vitamins-Minerals (MULTIVITAMIN WOMEN PO) Take 1 tablet by mouth daily.    . Norethindrone Acetate-Ethinyl Estradiol (MICROGESTIN) 1.5-30 MG-MCG tablet Take 1 tablet by mouth daily. 3 Package 3  . ondansetron (ZOFRAN ODT) 4 MG disintegrating tablet Take 1 tablet (4 mg total) by mouth every 8 (eight) hours as needed for nausea or vomiting. 60 tablet 3  . pilocarpine (SALAGEN) 5 MG tablet Take 5 mg by mouth 4 (four) times daily as needed (for dry mouth).     No current facility-administered medications for this visit.     Allergies as of 07/12/2018  . (No Known Allergies)    Family History  Problem Relation Age of Onset  . Diabetes Mother   . Diverticulitis Mother   . Colon polyps Mother   . Macular degeneration Maternal Grandmother   . Heart disease Maternal Grandmother   . Hypertension Maternal Grandfather   . Heart disease Maternal Grandfather   . Bone cancer Father   . Colon cancer Neg Hx   . Esophageal cancer Neg Hx   . Pancreatic cancer Neg Hx   . Stomach cancer Neg Hx   . Liver disease Neg Hx   . Rectal cancer Neg Hx     Social History   Socioeconomic History  . Marital status: Single    Spouse name: Not on file  . Number of children: 0  . Years of education: 56  . Highest education level: Not on file  Occupational History  . Occupation: Optometrist  Social Needs  . Financial resource strain: Not on file  . Food insecurity:    Worry: Not on file    Inability: Not on file  . Transportation needs:    Medical: Not on file    Non-medical: Not on file  Tobacco Use  . Smoking status: Never Smoker  . Smokeless tobacco: Never Used  Substance and Sexual Activity  . Alcohol  use: Yes    Comment: rare-occ. per pt  . Drug use: No  . Sexual activity: Yes  Lifestyle  . Physical activity:    Days per week: Not on file    Minutes per session: Not on file  . Stress: Not on file  Relationships  . Social connections:    Talks on phone: Not on file    Gets together: Not on file    Attends religious service: Not on file    Active member of club or organization: Not on file  Attends meetings of clubs or organizations: Not on file    Relationship status: Not on file  . Intimate partner violence:    Fear of current or ex partner: Not on file    Emotionally abused: Not on file    Physically abused: Not on file    Forced sexual activity: Not on file  Other Topics Concern  . 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 Reports this triggered all of her health problems.    Review of Systems:    Constitutional: No weight loss, fever or chills Cardiovascular: No chest pain Respiratory: No SOB  Gastrointestinal: See HPI and otherwise negative   Physical Exam:  Vital signs: BP 104/68   Pulse 94   Ht 5\' 5"  (1.651 m)   Wt 157 lb 4 oz (71.3 kg)   BMI 26.17 kg/m   Constitutional:   Caucasian female appears to be in NAD, Well developed, Well nourished, alert and cooperative Respiratory: Respirations even and unlabored. Lungs clear to auscultation bilaterally.   No wheezes, crackles, or rhonchi.  Cardiovascular: Normal S1, S2. No MRG. Regular rate and rhythm. No peripheral edema, cyanosis or pallor.  Gastrointestinal:  Soft, nondistended, mild RLQ and epigastric ttp. No rebound or guarding. Normal bowel sounds. No appreciable masses or hepatomegaly. Psychiatric: Demonstrates good judgement and reason without abnormal affect or behaviors.  No recent labs.  Assessment: 1.   Abdominal pain: Likely related to oxycodone and constipation on top of known IBS 2.  Mild gastroparesis: Prior gastric emptying study before Nissen fundoplication as reviewed on prior records, Reglan caused diarrhea but BuSpar is helping 3.  Dyspepsia: Currently no symptoms of nausea or vomiting  Plan: 1.  Encouraged the patient to restart her Zantac 150 mg as prescribed as well as MiraLAX once daily.  Discussed that she can titrate this up to 4 times a day if necessary. 2.  Prescribed Hydrocortisone ointment to be applied to hemorrhoid twice daily x7 days, repeating x1 week if needed. 3.  Discussed that likely the Oxycodone slowed her system even further and has caused her to have increased symptoms.  Did tell her she did the right thing by stopping this medication. 4.  If patient continues with symptoms after this "flare", again per Dr. Havery Moros would recommend an EGD. 5.  Advised patient to call and let us know how she is doing. 6.  Patient was made an appointment with Dr. Havery Moros in the next 4-6 weeks for follow-up.  Ellouise Newer, PA-C [facility] 07/12/2018, 10:49 AM  Cc: Lucretia Kern, DO

## 2018-07-12 NOTE — Telephone Encounter (Signed)
Left message for patient to call back  

## 2018-07-12 NOTE — Progress Notes (Signed)
Agree with assessment and plan as outlined. I have suspected the patient to have gas/bloat syndrome post Nissen, along with functional dyspepsia. She had been doing better on buspirone. I suspect narcotics have flared her symptoms and agree that she should stop them. Would recommend continuing buspirone or increasing to 15mg  twice daily if tolerated. She can follow up with me if symptoms persist. I think yield of repeat EGD is low.

## 2018-07-12 NOTE — Patient Instructions (Addendum)
Restart Miralax daily.  Can increase to twice daily if needed.  Restart Zantac as prescribed.  We have sent the following medications to your pharmacy for you to pick up at your convenience: Hydrocortisone Ointment  Follow up with Dr. Havery Moros on August 31, 2018 at 9 am.  Thank you for choosing me and [facility].   Ellouise Newer, PA-C

## 2018-07-12 NOTE — Telephone Encounter (Signed)
Spoke to patient, she has had worsening peri-umbilical abdominal pain for 2-3 days. She states that the nausea is better, she changed her diet to more plant based. She recently had shoulder surgery and had been on a narcotic for pain control, she stopped this several days ago. She denies any constipation, no blood in stool. She is taking the Buspar once a day. Scheduled to see APP this morning.

## 2018-08-16 ENCOUNTER — Ambulatory Visit (INDEPENDENT_AMBULATORY_CARE_PROVIDER_SITE_OTHER): Payer: BLUE CROSS/BLUE SHIELD

## 2018-08-16 ENCOUNTER — Ambulatory Visit (INDEPENDENT_AMBULATORY_CARE_PROVIDER_SITE_OTHER): Payer: BLUE CROSS/BLUE SHIELD | Admitting: Family Medicine

## 2018-08-16 ENCOUNTER — Encounter: Payer: Self-pay | Admitting: Family Medicine

## 2018-08-16 VITALS — BP 100/58 | HR 66 | Temp 98.1°F | Ht 65.0 in | Wt 157.1 lb

## 2018-08-16 DIAGNOSIS — R05 Cough: Secondary | ICD-10-CM

## 2018-08-16 DIAGNOSIS — R300 Dysuria: Secondary | ICD-10-CM

## 2018-08-16 DIAGNOSIS — R053 Chronic cough: Secondary | ICD-10-CM

## 2018-08-16 DIAGNOSIS — Z8719 Personal history of other diseases of the digestive system: Secondary | ICD-10-CM

## 2018-08-16 DIAGNOSIS — J309 Allergic rhinitis, unspecified: Secondary | ICD-10-CM

## 2018-08-16 DIAGNOSIS — R635 Abnormal weight gain: Secondary | ICD-10-CM

## 2018-08-16 LAB — POCT URINALYSIS DIPSTICK
Bilirubin, UA: NEGATIVE
Blood, UA: NEGATIVE
Glucose, UA: NEGATIVE
Ketones, UA: NEGATIVE
Nitrite, UA: NEGATIVE
Protein, UA: NEGATIVE
Spec Grav, UA: 1.015 (ref 1.010–1.025)
Urobilinogen, UA: 0.2 E.U./dL
pH, UA: 7 (ref 5.0–8.0)

## 2018-08-16 NOTE — Progress Notes (Signed)
HPI:  Using dictation device. Unfortunately this device frequently misinterprets words/phrases.  Joy Taylor is a very pleasant 22 year old with a complicated past medical history.  She received Ehlers-Danlos syndrome and fibromyalgia.  She sees a rheumatologist for rheumatoid arthritis and now is on injectable methotrexate.  She had her flu shot a few weeks ago.  She is here for several issues today.  First of all, she has had dysuria for a few days.  Symptoms include increased urinary frequency and urgency.  No abdominal pain, fevers, malaise, hematuria, vaginal symptoms or concerns for sexually transmitted infections.  First day last menstrual period was 08/02/2018. Also, she reports a chronic cough for about 1 year.  Reports this is a dry cough.  She is worried about her lungs.  She also has past medical history significant for GERD, sees a gastroenterologist, and also allergies.  She is taking omeprazole, but feels like it never has worked.  She is not taking any of her medicines for allergies currently.  No fevers, weight loss or shortness of breath. Reports gets vaccines with rheumatologist. Had flu shot several weeks ago. She has had a mild sore throat and postnasal drip for a few weeks.  Also some right ear pain at times. She has gained some weight over the last year and would like to see a nutritionist to help with her diet.  She is trying to eat less meat dairy.  She would like some additional guidance.  She also has a history of gastroparesis.  ROS: See pertinent positives and negatives per HPI.  Past Medical History:  Diagnosis Date  . Anxiety   . Depression   . Ehlers-Danlos disease   . Fibromyalgia   . Genital herpes    diagnosed by GYN-per patient no treatment advised unless daily flare ups occur  . GERD (gastroesophageal reflux disease)   . IBS (irritable bowel syndrome)   . Kidney stones   . Low BP    post-op  . Lupus ([place])   . Migraine   . Rheumatoid arthritis ([place])    . Stomach problems     Past Surgical History:  Procedure Laterality Date  . HIP ARTHROSCOPY Right 07/15/2016, 08/06/2015  . KNEE ARTHROSCOPY Right   . NISSEN FUNDOPLICATION  64/40/3474  . ROTATOR CUFF REPAIR Right 2010  . SHOULDER ARTHROSCOPY Right 06/29/2018    Family History  Problem Relation Age of Onset  . Diabetes Mother   . Diverticulitis Mother   . Colon polyps Mother   . Macular degeneration Maternal Grandmother   . Heart disease Maternal Grandmother   . Hypertension Maternal Grandfather   . Heart disease Maternal Grandfather   . Bone cancer Father   . Colon cancer Neg Hx   . Esophageal cancer Neg Hx   . Pancreatic cancer Neg Hx   . Stomach cancer Neg Hx   . Liver disease Neg Hx   . Rectal cancer Neg Hx     SOCIAL HX: See HPI   Current Outpatient Medications:  .  acetaminophen (TYLENOL) 325 MG tablet, Take 2 tablets (650 mg total) by mouth every 6 (six) hours as needed. Do not take more than 4000mg  of tylenol per day, Disp: 30 tablet, Rfl: 0 .  AMBULATORY NON FORMULARY MEDICATION, Take 1 tablet by mouth as needed. oxycotin 5mg , Disp: , Rfl:  .  aspirin-acetaminophen-caffeine (EXCEDRIN MIGRAINE) 250-250-65 MG tablet, Take 1 tablet by mouth every 6 (six) hours as needed for headache., Disp: , Rfl:  .  busPIRone (BUSPAR) 15  MG tablet, Take 1 tablet (15 mg total) by mouth 2 (two) times daily., Disp: 60 tablet, Rfl: 2 .  divalproex (DEPAKOTE) 500 MG DR tablet, TAKE 1 TABLET BY MOUTH TWICE A DAY, Disp: 60 tablet, Rfl: 3 .  fluticasone (FLONASE) 50 MCG/ACT nasal spray, Place 1 spray into both nostrils daily., Disp: , Rfl:  .  gabapentin (NEURONTIN) 600 MG tablet, Take 1 tablet (600 mg total) by mouth 3 (three) times daily., Disp: 270 tablet, Rfl: 3 .  hydrocortisone 1 % ointment, Apply 1 application topically 2 (two) times daily. Apply to hemorrhoid x 1 week, repeat x 1 week if needed., Disp: 30 g, Rfl: 0 .  Ibuprofen (IBU PO), Take 3 tablets by mouth 2 (two) times daily.,  Disp: , Rfl:  .  methotrexate 50 MG/2ML injection, TAKE 0.4ML ONCE WEEKLY 28 DAYS, Disp: , Rfl: 2 .  Multiple Vitamins-Minerals (MULTIVITAMIN WOMEN PO), Take 1 tablet by mouth daily., Disp: , Rfl:  .  Norethindrone Acetate-Ethinyl Estradiol (MICROGESTIN) 1.5-30 MG-MCG tablet, Take 1 tablet by mouth daily., Disp: 3 Package, Rfl: 3 .  omeprazole (PRILOSEC) 40 MG capsule, Take 40 mg by mouth daily., Disp: , Rfl:  .  ondansetron (ZOFRAN ODT) 4 MG disintegrating tablet, Take 1 tablet (4 mg total) by mouth every 8 (eight) hours as needed for nausea or vomiting., Disp: 60 tablet, Rfl: 3 .  pilocarpine (SALAGEN) 5 MG tablet, Take 5 mg by mouth 4 (four) times daily as needed (for dry mouth)., Disp: , Rfl:  .  RANITIDINE HCL PO, Take by mouth., Disp: , Rfl:   EXAM:  Vitals:   08/16/18 1140  BP: (!) 100/58  Pulse: 66  Temp: 98.1 F (36.7 C)    Body mass index is 26.14 kg/m.  GENERAL: vitals reviewed and listed above, alert, oriented, appears well hydrated and in no acute distress  HEENT: atraumatic, conjunttiva clear, no obvious abnormalities on inspection of external nose and ears, normal appearance of ear canals and TMs, clear nasal congestion, mild post oropharyngeal erythema with PND, no tonsillar edema or exudate, no sinus TTP  NECK: no obvious masses on inspection  LUNGS: clear to auscultation bilaterally, no wheezes, rales or rhonchi, good air movement  CV: HRRR, no peripheral edema  ABD: soft, nontender, no cva ttp  MS: moves all extremities without noticeable abnormality  PSYCH: pleasant and cooperative, no obvious depression or anxiety  ASSESSMENT AND PLAN:  Discussed the following assessment and plan:  Dysuria - Plan: POC Urinalysis Dipstick  Chronic cough - Plan: DG Chest 2 View  Hx of gastroesophageal reflux (GERD)  Allergic rhinitis, unspecified seasonality, unspecified trigger  Weight gain - Plan: Amb ref to Medical Nutrition Therapy-MNT  -udip ok, culture  pending -we discussed possible serious and likely etiologies, workup and treatment, treatment risks and return precautions for a chronic cough; most common causes are PND from allergies or silent reflux. In her case with her hx silent reflux is very likely, vs pnd from allergies, vs other. Will get CXR given her immunocomprimised status and did advise referral to pulm if persists or not responding to treatment. Advised follow up in 3 weeks if persist.s -referral to nutrition per her requests -of course, we advised [place]  to return or notify a doctor immediately if symptoms worsen or persist or new concerns arise.   Patient Instructions  We will send for a culture on the urine. Drink plenty of water and let us know if symptoms worsen or persist.  Get the xray  before you leave.  Try Nexium instead for omeprazole for a few weeks and follow up with your gastroenterologist.  Try flonase 2 sprays each nostril daily for 3 weeks.  Let your rheumatologist know about the cough.  Follow up in 3 weeks if cough persists despite these measures.   Lucretia Kern, DO

## 2018-08-16 NOTE — Patient Instructions (Signed)
We will send for a culture on the urine. Drink plenty of water and let us know if symptoms worsen or persist.  Get the xray before you leave.  Try Nexium instead for omeprazole for a few weeks and follow up with your gastroenterologist.  Try flonase 2 sprays each nostril daily for 3 weeks.  Let your rheumatologist know about the cough.  Follow up in 3 weeks if cough persists despite these measures.

## 2018-08-17 LAB — URINE CULTURE
MICRO NUMBER:: 91177850
Result:: NO GROWTH
SPECIMEN QUALITY:: ADEQUATE

## 2018-08-20 ENCOUNTER — Other Ambulatory Visit: Payer: Self-pay | Admitting: Neurology

## 2018-08-24 ENCOUNTER — Encounter (HOSPITAL_COMMUNITY): Payer: Self-pay

## 2018-08-24 ENCOUNTER — Ambulatory Visit (HOSPITAL_COMMUNITY)
Admission: EM | Admit: 2018-08-24 | Discharge: 2018-08-24 | Disposition: A | Discharge status: Home / Self Care | Payer: BLUE CROSS/BLUE SHIELD | Attending: Family Medicine | Admitting: Family Medicine

## 2018-08-24 DIAGNOSIS — J0100 Acute maxillary sinusitis, unspecified: Secondary | ICD-10-CM | POA: Diagnosis not present

## 2018-08-24 MED ORDER — DOXYCYCLINE HYCLATE 100 MG PO CAPS: 100.0000 mg | ORAL_CAPSULE | Freq: Two times a day (BID) | ORAL | 0 refills | Status: DC

## 2018-08-24 NOTE — ED Triage Notes (Signed)
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

## 2018-08-24 NOTE — ED Provider Notes (Addendum)
[place]   [id number] 08/24/18 Arrival Time: 8315   CC: Sinus pressure and congestion  SUBJECTIVE: History from: patient.  Joy Taylor is a 22 y.o. female who presents with onset of sinus pressure, nasal congestion, and facial pain/pressure for the past week.  Admits to positive sick exposure.  States symptoms were improving, and then got worse.  Has tried OTC medications without relief.  Symptoms are made worse with leaning forward.  Reports previous symptoms in the past and diagnosed with sinusitis. Complains of subjective fever, chills, and fatigue.  Denies sore throat, SOB, wheezing, chest pain, nausea, changes in bowel or bladder habits.    Hx significant for RA and fibromyalgia.  Currently receiving methotrexate for RA.    ROS: As per HPI.  Past Medical History:  Diagnosis Date  . Anxiety   . Depression   . Ehlers-Danlos disease   . Fibromyalgia   . Genital herpes    diagnosed by GYN-per patient no treatment advised unless daily flare ups occur  . GERD (gastroesophageal reflux disease)   . IBS (irritable bowel syndrome)   . Kidney stones   . Low BP    post-op  . Lupus ([place])   . Migraine   . Rheumatoid arthritis ([place])   . Stomach problems    Past Surgical History:  Procedure Laterality Date  . HIP ARTHROSCOPY Right 07/15/2016, 08/06/2015  . KNEE ARTHROSCOPY Right   . NISSEN FUNDOPLICATION  17/61/6073  . ROTATOR CUFF REPAIR Right 2010  . SHOULDER ARTHROSCOPY Right 06/29/2018   No Known Allergies No current facility-administered medications on file prior to encounter.    Current Outpatient Medications on File Prior to Encounter  Medication Sig Dispense Refill  . acetaminophen (TYLENOL) 325 MG tablet Take 2 tablets (650 mg total) by mouth every 6 (six) hours as needed. Do not take more than 4000mg  of tylenol per day 30 tablet 0  . AMBULATORY NON FORMULARY MEDICATION Take 1 tablet by mouth as needed. oxycotin 5mg     . aspirin-acetaminophen-caffeine  (EXCEDRIN MIGRAINE) 250-250-65 MG tablet Take 1 tablet by mouth every 6 (six) hours as needed for headache.    . busPIRone (BUSPAR) 15 MG tablet Take 1 tablet (15 mg total) by mouth 2 (two) times daily. 60 tablet 2  . divalproex (DEPAKOTE) 500 MG DR tablet Take 1 tablet (500 mg total) by mouth 2 (two) times daily. Must keep pending appt. with Clabe Seal, NP on 10/11/18, arrival time of 11am 60 tablet 1  . fluticasone (FLONASE) 50 MCG/ACT nasal spray Place 1 spray into both nostrils daily.    Marland Kitchen gabapentin (NEURONTIN) 600 MG tablet Take 1 tablet (600 mg total) by mouth 3 (three) times daily. 270 tablet 3  . hydrocortisone 1 % ointment Apply 1 application topically 2 (two) times daily. Apply to hemorrhoid x 1 week, repeat x 1 week if needed. 30 g 0  . Ibuprofen (IBU PO) Take 3 tablets by mouth 2 (two) times daily.    . methotrexate 50 MG/2ML injection TAKE 0.4ML ONCE WEEKLY 28 DAYS  2  . Multiple Vitamins-Minerals (MULTIVITAMIN WOMEN PO) Take 1 tablet by mouth daily.    . Norethindrone Acetate-Ethinyl Estradiol (MICROGESTIN) 1.5-30 MG-MCG tablet Take 1 tablet by mouth daily. 3 Package 3  . omeprazole (PRILOSEC) 40 MG capsule Take 40 mg by mouth daily.    . ondansetron (ZOFRAN ODT) 4 MG disintegrating tablet Take 1 tablet (4 mg total) by mouth every 8 (eight) hours as needed for nausea or vomiting. 60 tablet  3  . pilocarpine (SALAGEN) 5 MG tablet Take 5 mg by mouth 4 (four) times daily as needed (for dry mouth).    . RANITIDINE HCL PO Take by mouth.     Social History   Socioeconomic History  . Marital status: Single    Spouse name: Not on file  . Number of children: 0  . Years of education: 27  . Highest education level: Not on file  Occupational History  . Occupation: Optometrist  Social Needs  . Financial resource strain: Not on file  . Food insecurity:    Worry: Not on file    Inability: Not on file  . Transportation needs:    Medical: Not on file    Non-medical: Not on file  Tobacco  Use  . Smoking status: Never Smoker  . Smokeless tobacco: Never Used  Substance and Sexual Activity  . Alcohol use: Yes    Comment: rare-occ. per pt  . Drug use: No  . Sexual activity: Yes  Lifestyle  . Physical activity:    Days per week: Not on file    Minutes per session: Not on file  . Stress: Not on file  Relationships  . Social connections:    Talks on phone: Not on file    Gets together: Not on file    Attends religious service: Not on file    Active member of club or organization: Not on file    Attends meetings of clubs or organizations: Not on file    Relationship status: Not on file  . Intimate partner violence:    Fear of current or ex partner: Not on file    Emotionally abused: Not on file    Physically abused: Not on file    Forced sexual activity: Not on file  Other Topics Concern  . 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 Reports this triggered all of her health problems.   Family History  Problem Relation Age of Onset  . Diabetes Mother   . Diverticulitis Mother   . Colon polyps Mother   . Macular degeneration Maternal Grandmother   . Heart disease Maternal Grandmother   . Hypertension Maternal Grandfather   . Heart disease Maternal Grandfather   . Bone cancer Father   . Colon cancer Neg Hx   . Esophageal cancer Neg Hx   . Pancreatic cancer Neg Hx   . Stomach cancer Neg Hx   . Liver disease Neg Hx   . Rectal cancer Neg Hx     OBJECTIVE:  Vitals:   08/24/18 1559  BP: (!) 99/55  Pulse: 70  Resp: 20  Temp: 97.7 F (36.5 C)  TempSrc: Oral  SpO2: 100%     General appearance: alert; appears fatigued; nontoxic HEENT: Ears: EACs clear, TMs pearly gray; Eyes: PERRL.  EOM grossly intact.  Maxillary sinus tenderness; Nose: mild clear rhinorrhea; tonsils  nonerythematous, uvula midline Neck: supple without LAD Lungs: CTAB Heart: regular rate and rhythm.  Radial pulses 2+ symmetrical bilaterally Skin: warm and dry Psychological: alert and cooperative; normal mood and affect  ASSESSMENT & PLAN:  1. Acute non-recurrent maxillary sinusitis     Meds ordered this encounter  Medications  . doxycycline (VIBRAMYCIN) 100  MG capsule    Sig: Take 1 capsule (100 mg total) by mouth 2 (two) times daily.    Dispense:  20 capsule    Refill:  0    Order Specific Question:   Supervising Provider    Answer:   Wynona Luna [[id number]]    Get plenty of rest and push fluids Prescribed doxycycline.  Take as directed and to completion.  Continue with OTC medications as needed for symptomatic relief Follow up with PCP if symptoms persist Return or go to ER if you have any new or worsening symptoms   Reviewed expectations re: course of current medical issues. Questions answered. Outlined signs and symptoms indicating need for more acute intervention. Patient verbalized understanding. After Visit Summary given.    Lestine Box, PA-C 08/24/18 1657

## 2018-08-24 NOTE — Discharge Instructions (Signed)
Get plenty of rest and push fluids Prescribed doxycycline.  Take as directed and to completion.  Continue with OTC medications as needed for symptomatic relief Follow up with PCP if symptoms persist Return or go to ER if you have any new or worsening symptoms

## 2018-08-31 ENCOUNTER — Encounter: Payer: Self-pay | Admitting: Gastroenterology

## 2018-08-31 ENCOUNTER — Ambulatory Visit (INDEPENDENT_AMBULATORY_CARE_PROVIDER_SITE_OTHER): Payer: BLUE CROSS/BLUE SHIELD | Admitting: Gastroenterology

## 2018-08-31 VITALS — BP 112/72 | HR 79 | Ht 65.0 in | Wt 152.0 lb

## 2018-08-31 DIAGNOSIS — K9289 Other specified diseases of the digestive system: Secondary | ICD-10-CM

## 2018-08-31 DIAGNOSIS — R1013 Epigastric pain: Secondary | ICD-10-CM

## 2018-08-31 MED ORDER — BUSPIRONE HCL 15 MG PO TABS: 15.0000 mg | ORAL_TABLET | Freq: Every day | ORAL | 3 refills | Status: DC

## 2018-08-31 MED ORDER — AMBULATORY NON FORMULARY MEDICATION: Status: DC

## 2018-08-31 MED ORDER — FAMOTIDINE 20 MG PO TABS: 20.0000 mg | ORAL_TABLET | Freq: Two times a day (BID) | ORAL | 3 refills | Status: DC | PRN

## 2018-08-31 NOTE — Patient Instructions (Addendum)
If you are age 24 or older, your body mass index should be between 23-30. Your Body mass index is 25.29 kg/m. If this is out of the aforementioned range listed, please consider follow up with your Primary Care Provider.  If you are age 38 or younger, your body mass index should be between 19-25. Your Body mass index is 25.29 kg/m. If this is out of the aformentioned range listed, please consider follow up with your Primary Care Provider.   We have sent the following medications to your pharmacy for you to pick up at your convenience: Pepcid 20mg : Take twice a day as need  (Stop taking Zantac (ranitidine)  Decrease buspirone 15 mg to once at bedtime.  We are giving you samples of FDgard today.  Take as directed with meals. If they are helpful you can purchase them over the counter.   Thank you for entrusting me with your care and for choosing [facility], Dr.  Cellar

## 2018-08-31 NOTE — Progress Notes (Signed)
HPI :  22 year old female here for follow-up visit. history of Ehlers'Danlos syndrome, RA / SLE,and history of refluxwho had Nissen fundoplication in 05/9389, At that time she reported severe regurgitation of gastric contents refractory to medicationswhich led to the surgery. While the surgery worked too treat her reflux, she had significant symptoms with postprandial abdominal pain and bloating. She's had evaluation with [place] since that time. She's had a prior CT scan which has not showed any pathology. She had an EGD with me in May which showed normal postsurgical anatomy, biopsies negative for H. Pylori. She a gastric emptying study at the end of May as well which was normal.  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 She's had trials of PPIs and Reglan in the past without benefit.  We gave her a trial of buspirone dosed at 15 mg once daily. This provided benefit to her symptoms and she increased to 30 mg. She reports that this dose this is the best she has felt in regards to her stomach symptoms. She's had a much better appetite and tolerates eating much better on high-dose buspirone. Unfortunately she has developed some periodic symptoms of feeling disoriented with "head shakiness" and blurred vision. This occurs about once per week. The only other medication change that has occurred since this has started was she began methotrexate. She is concerned she is having side effects from the Buspirone. She otherwise was on Zantac at this time and has continued to take it. She does not have much reflux symptoms, she is thinks Zantac didn't help her symptoms somewhat.  The patient otherwise states she thinks she is having episodes of low blood sugar.  She states she feels shaking/anxious and breaks out in a cold sweat.  She eats something and she states she feels better.  She has purchased a glucometer for her house but has not used it so to  clarify if she is truly having hypoglycemic episodes. We discussed this at the last visit.   EGD 03/18/2018 - normal, h/o Nissen fundoplication - bx negative for HP GES 04/14/18 - normal  Prior workup.  CT abdomen without contrast 03/02/2017 - normal Gastric emptying study 09/01/16 - "mildly delayed" emptying [place] RUQ 05/12/2016 - normal UGI series on 03/05/2017- no abnormality reported, surgical site looked okay.  Past Medical History:  Diagnosis Date  . Anxiety   . Depression   . Ehlers-Danlos disease   . Fibromyalgia   . Genital herpes    diagnosed by GYN-per patient no treatment advised unless daily flare ups occur  . GERD (gastroesophageal reflux disease)   . IBS (irritable bowel syndrome)   . Kidney stones   . Low BP    post-op  . Lupus ([place])   . Migraine   . Rheumatoid arthritis ([place])   . Stomach problems      Past Surgical History:  Procedure Laterality Date  . HIP ARTHROSCOPY Right 07/15/2016, 08/06/2015  . KNEE ARTHROSCOPY Right   . NISSEN FUNDOPLICATION  30/07/2329  . ROTATOR CUFF REPAIR Right 2010  . SHOULDER ARTHROSCOPY Right 06/29/2018   Family History  Problem Relation Age of Onset  . Diabetes Mother   . Diverticulitis Mother   . Colon polyps Mother   . Macular degeneration Maternal Grandmother   . Heart disease Maternal Grandmother   . Hypertension Maternal Grandfather   . Heart disease Maternal Grandfather   . Bone cancer Father   . Colon cancer Neg  Hx   . Esophageal cancer Neg Hx   . Pancreatic cancer Neg Hx   . Stomach cancer Neg Hx   . Liver disease Neg Hx   . Rectal cancer Neg Hx    Social History   Tobacco Use  . Smoking status: Never Smoker  . Smokeless tobacco: Never Used  Substance Use Topics  . Alcohol use: Yes    Comment: rare-occ. per pt  . Drug use: No   Current Outpatient Medications  Medication Sig Dispense Refill  . acetaminophen (TYLENOL) 325 MG tablet Take 2 tablets (650 mg total) by mouth every 6 (six) hours as needed. Do  not take more than 4000mg  of tylenol per day 30 tablet 0  . AMBULATORY NON FORMULARY MEDICATION Take 1 tablet by mouth as needed. oxycotin 5mg     . aspirin-acetaminophen-caffeine (EXCEDRIN MIGRAINE) 250-250-65 MG tablet Take 1 tablet by mouth every 6 (six) hours as needed for headache.    . busPIRone (BUSPAR) 15 MG tablet Take 1 tablet (15 mg total) by mouth 2 (two) times daily. 60 tablet 2  . divalproex (DEPAKOTE) 500 MG DR tablet Take 1 tablet (500 mg total) by mouth 2 (two) times daily. Must keep pending appt. with Clabe Seal, NP on 10/11/18, arrival time of 11am 60 tablet 1  . fluticasone (FLONASE) 50 MCG/ACT nasal spray Place 1 spray into both nostrils daily.    Marland Kitchen FOLIC ACID PO Take 1 tablet by mouth daily.    Marland Kitchen gabapentin (NEURONTIN) 600 MG tablet Take 1 tablet (600 mg total) by mouth 3 (three) times daily. 270 tablet 3  . hydrocortisone 1 % ointment Apply 1 application topically 2 (two) times daily. Apply to hemorrhoid x 1 week, repeat x 1 week if needed. 30 g 0  . Ibuprofen (IBU PO) Take 3 tablets by mouth 2 (two) times daily.    . methotrexate 50 MG/2ML injection TAKE 0.4ML ONCE WEEKLY 28 DAYS  2  . Multiple Vitamins-Minerals (MULTIVITAMIN WOMEN PO) Take 1 tablet by mouth daily.    . Norethindrone Acetate-Ethinyl Estradiol (MICROGESTIN) 1.5-30 MG-MCG tablet Take 1 tablet by mouth daily. 3 Package 3  . omeprazole (PRILOSEC) 40 MG capsule Take 40 mg by mouth daily.    . ondansetron (ZOFRAN ODT) 4 MG disintegrating tablet Take 1 tablet (4 mg total) by mouth every 8 (eight) hours as needed for nausea or vomiting. 60 tablet 3  . pilocarpine (SALAGEN) 5 MG tablet Take 5 mg by mouth 4 (four) times daily as needed (for dry mouth).    . RANITIDINE HCL PO Take by mouth.     No current facility-administered medications for this visit.    No Known Allergies   Review of Systems: All systems reviewed and negative except where noted in HPI.    Physical Exam: BP 112/72   Pulse 79   Ht 5\' 5"   (1.651 m)   Wt 152 lb (68.9 kg)   LMP 08/02/2018 (Exact Date)   BMI 25.29 kg/m  Constitutional: Pleasant,well-developed, female in no acute distress. HEENT: Normocephalic and atraumatic. Conjunctivae are normal. No scleral icterus. Neck supple.  Cardiovascular: Normal rate, regular rhythm.  Pulmonary/chest: Effort normal and breath sounds normal. No wheezing, rales or rhonchi. Abdominal: Soft, nondistended, nontender. There are no masses palpable. No hepatomegaly. Extremities: no edema Lymphadenopathy: No cervical adenopathy noted. Neurological: Alert and oriented to person place and time. Skin: Skin is warm and dry. No rashes noted. Psychiatric: Normal mood and affect. Behavior is normal.   ASSESSMENT AND PLAN: 22 year old  female here for reassessment of the following issues:  Dyspepsia / gas bloat syndrome - extensive evaluation, I suspect this is the likely diagnosis to account for her symptoms. Since starting buspirone her symptoms have significantly improved. While higher doses of buspirone have provided more benefit to her from her stomach, she questions the relation of some of these episodic neurologic symptoms to the medication. It is possible that these symptoms could be related to the buspirone at high dosing - she takes it all q HS and not twice daily. I think we should decrease the buspirone to 15 mg once at night and see if she tolerates this better. As below, recommend she record the blood glucose in the setting of some of the symptoms to ensure she is not having hypoglycemia. I also recommend she follow-up with her neurologist which she is scheduled to see soon and relay these symptoms to them as well. Otherwise recommend she stop the Zantac given the recent use reports about this, and switched to Pepcid 20 mg as needed. We'll also give her a trial of [place] with meals to see if that will help some of her symptoms as she decreases the buspirone. Samples of FD Donald Prose provided. She  will update me on how she is doing in a few weeks. If she doesn't tolerate buspirone in the future, may consider Remeron  Possible hypoglycemia - patient endorses symptoms concerning for hypoglycemic episodes, however she has not documented her blood sugar in association with these episodes.  Recommend again to her that she use her glucometer at home and check her blood sugar when she is feeling poorly.  If she is truly hypoglycemic, along with her weight gain, will need to be evaluated for insulinoma and refer to endocrine.  She agreed and will start keeping track of her blood sugars and contact me with these results.  [place] Cellar, MD [facility]

## 2018-09-05 ENCOUNTER — Encounter: Payer: BLUE CROSS/BLUE SHIELD | Attending: Family Medicine | Admitting: Registered"

## 2018-09-05 ENCOUNTER — Encounter: Payer: Self-pay | Admitting: Registered"

## 2018-09-05 DIAGNOSIS — R635 Abnormal weight gain: Secondary | ICD-10-CM | POA: Insufficient documentation

## 2018-09-05 DIAGNOSIS — Z713 Dietary counseling and surveillance: Secondary | ICD-10-CM | POA: Insufficient documentation

## 2018-09-05 NOTE — Progress Notes (Signed)
  Medical Nutrition Therapy:  Appt start time: 3:25 end time:  4:00.  Prefers to be called "Alyssa" Assessment:  Primary concerns today:  Pt expectations: to be healthier, guidance on what to eat, and how much   Pt reports GI history of IBS, dyspepsia, Nissen fundoplication. Pt states she has chronic depression and anxiety; goes to therapy 2 times/month. Pt states she is currently attending trauma counseling. Pt states medications make her nauseas a lot. Pt states she has been vegetarian for 3 months to help reduce health issues. Pt states she had Nissen fundoplication surgery last year. Pt states her stomach does not relax, taking  buspirone to treat stomach. Pt states she has a GI specialist who is treating stomach issues. Pt states she was depressed when it was time to eat.   Pt states she wants to be vegan, limiting meat and dairy for the purpose of being healthy. Pt states looks at ingredients on food items. Pt states she has gained 15 lbs over the past year. Pt states she wants to be in the 130's, improve energy level and be more healthy overall. Pt states obesity and morbid obesity runs in her family.   Pt states she is skeptical of dietitians because her mom has worked with one. Pt states she may not eat due to not feeling good. 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   Pt reports triggers for stomach discomfort: meat, dairy, and eating too quickly   Preferred Learning Style:   No preference indicated   Learning Readiness:   Contemplating  Ready  Change in progress   MEDICATIONS: See list   DIETARY INTAKE:  Usual eating pattern includes 1 meals and 2-3 snacks per day.  Everyday foods include meat substitutes, beans, dairy-free yogurt, salad, fruit, and soup.  Avoided foods include meat and limits dairy.    24-hr recall:  B ( AM): sometimes skips; dairy-free yogurt or protein  bar Snk ( AM): none  L ( PM): sometimes skips; salad + fruit or soup Snk ( PM): sometimes dairy-free yogurt D ( PM): Morning Star-vegetarian sausage, chicken patty or alternative meat + beans Snk ( PM): sometimes banana chips Beverages: water sometimes with crystal light  Usual physical activity: plans to begin jujitsu; gym cardio + light weights + strength training 60-90 min, 3 days/week  Estimated energy needs: 2000 calories 225 g carbohydrates 150 g protein 56 g fat  Progress Towards Goal(s):  In progress.   Nutritional Diagnosis:  NB-1.1 Food and nutrition-related knowledge deficit As related to lack of prior nutrition-related information.  As evidenced by verbalizes incomplete information.    Intervention:  Nutrition education and counseling. Pt was educated and counseled on the importance of eating to nourish her body, balanced meals, and mindful eating. Pt was in agreement with goals listed.  Goals: - Aim to eat least 3 meals a day. Try not to skip breakfast or lunch.  - Eat meals at dining room table to help with mindful eating.  Teaching Method Utilized:  Visual Auditory Hands on  Handouts given during visit include:  Meal Ideas Sheet  Barriers to learning/adherence to lifestyle change: school-work-life balance  Demonstrated degree of understanding via:  Teach Back   Monitoring/Evaluation:  Dietary intake, exercise, and body weight in 1 month(s).

## 2018-09-05 NOTE — Patient Instructions (Addendum)
-   Aim to eat least 3 meals a day. Try not to skip breakfast or lunch.   - Eat meals at dining room table to help with mindful eating.

## 2018-10-04 ENCOUNTER — Encounter: Payer: BLUE CROSS/BLUE SHIELD | Attending: Family Medicine | Admitting: Registered"

## 2018-10-04 ENCOUNTER — Encounter: Payer: Self-pay | Admitting: Registered"

## 2018-10-04 DIAGNOSIS — Z713 Dietary counseling and surveillance: Secondary | ICD-10-CM | POA: Diagnosis not present

## 2018-10-04 DIAGNOSIS — R635 Abnormal weight gain: Secondary | ICD-10-CM | POA: Insufficient documentation

## 2018-10-04 NOTE — Patient Instructions (Signed)
-   Be sure to include vegetables with lunch and dinner.   - Aim to increase physical activity to 4-5 days/week.   - Keep up the great work with goals established from previous visit.

## 2018-10-04 NOTE — Progress Notes (Signed)
Medical Nutrition Therapy:  Appt start time: 2:10 end time:  2:45.  Prefers to be called "Alyssa" Assessment:  Primary concerns today:  Pt expectations: to be healthier, guidance on what to eat, and how much   Pt reports GI history of IBS, dyspepsia, Nissen fundoplication.   Pt states her mom is cooking vegan food for her for Thanksgiving. Pt states she recently broke up with her boyfriend; has been stressed lately due to this. Pt states she stopped eating meat in July 2019. Pt states gelatin is not good for you. Pt states her weight has been stagnant. Pt states she has been eating more fruit and eating about 3 meals a day. Pt states dairy makes her choke a lot, especially if its thick. Pt states this is why she does not prefer dairy but will still consume it sometimes because it is challenging to avoid. Pt states she is willing to buy vitamins to meet potential deficiencies. Pt states she has been able to dine more at kitchen table and she likes it.  Pt states she has chronic depression and anxiety; goes to therapy 2 times/month. Pt states she is currently attending trauma counseling. Pt states medications make her nauseas a lot. Pt states she has been vegetarian for 3 months to help reduce health issues. Pt states she had Nissen fundoplication surgery last year. Pt states her stomach does not relax, taking  buspirone to treat stomach. Pt states she has a GI specialist who is treating stomach issues. Pt states she was depressed when it was time to eat.   Pt states she wants to be vegan, limiting meat and dairy for the purpose of being healthy. Pt states looks at ingredients on food items. Pt states she has gained 15 lbs over the past year. Pt states she wants to be in the 130's, improve energy level and be more healthy overall. Pt states obesity and morbid obesity runs in her family.   Pt states she is skeptical of dietitians because her mom has worked with one. Pt states she may not eat due to not  feeling good. 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   Pt reports triggers for stomach discomfort: meat, dairy, and eating too quickly   Preferred Learning Style:   No preference indicated   Learning Readiness:   Contemplating  Ready  Change in progress   MEDICATIONS: See list   DIETARY INTAKE:  Usual eating pattern includes 3 meals and 2-3 snacks per day.  Everyday foods include meat substitutes, beans, dairy-free yogurt, salad, fruit, and soup.  Avoided foods include meat and limits dairy.    24-hr recall:  B ( AM): dairy-free yogurt or peanut butter protein bar Snk ( AM): none  L ( PM): salad + fruit + peanut butter Snk ( PM): sometimes dairy-free yogurt D ( PM): Morning Star-vegetarian sausage, chicken patty or alternative meat + beans + rice Snk ( PM): sometimes banana chips Beverages: water sometimes with crystal light (60-70 oz)  Usual physical activity: plans to begin jujitsu; gym cardio + light weights + strength training 60-90 min, 3 days/week  Estimated energy needs: 2000 calories 225 g carbohydrates 150 g protein 56 g fat  Progress Towards Goal(s):  In progress.   Nutritional Diagnosis:  NB-1.1 Food and nutrition-related knowledge deficit As related to lack of prior nutrition-related information.  As evidenced by verbalizes incomplete information.    Intervention:  Nutrition education and counseling. Pt was counseled on the common deficiencies with veganism,  importance of eating to nourish her body, balanced meals, and physical activity. Pt was also encouraged related to behavioral changes from previous visit. Pt was in agreement with goals listed.  Goals: - Be sure to include vegetables with lunch and dinner.  - Aim to increase physical activity to 4-5 days/week.  - Keep up the great work with goals established from previous visit.   Teaching Method  Utilized:  Visual Auditory Hands on  Handouts given during visit include:  none  Barriers to learning/adherence to lifestyle change: school-work-life balance  Demonstrated degree of understanding via:  Teach Back   Monitoring/Evaluation:  Dietary intake, exercise, and body weight in 1 month(s).

## 2018-10-11 ENCOUNTER — Ambulatory Visit (INDEPENDENT_AMBULATORY_CARE_PROVIDER_SITE_OTHER): Payer: BLUE CROSS/BLUE SHIELD | Admitting: Adult Health

## 2018-10-11 ENCOUNTER — Encounter: Payer: Self-pay | Admitting: Adult Health

## 2018-10-11 ENCOUNTER — Encounter

## 2018-10-11 VITALS — BP 103/63 | HR 68 | Ht 65.0 in | Wt 154.8 lb

## 2018-10-11 DIAGNOSIS — Z5181 Encounter for therapeutic drug level monitoring: Secondary | ICD-10-CM

## 2018-10-11 DIAGNOSIS — R413 Other amnesia: Secondary | ICD-10-CM

## 2018-10-11 DIAGNOSIS — G43809 Other migraine, not intractable, without status migrainosus: Secondary | ICD-10-CM | POA: Diagnosis not present

## 2018-10-11 DIAGNOSIS — M797 Fibromyalgia: Secondary | ICD-10-CM

## 2018-10-11 NOTE — Progress Notes (Signed)
PATIENT: Joy Taylor DOB: 06-29-1996  REASON FOR VISIT: follow up HISTORY FROM: patient  HISTORY OF PRESENT ILLNESS: Today 10/11/18:  Joy Taylor is a 22 year old female with a history of fibromyalgia and migraine headaches.  She returns today for follow-up.  She states that she is no longer on methotrexate.  Therefore her headaches have improved.  She states in the last couple weeks she has been having "electrical headaches."  She states that these headaches occur on the left side of the face and usually is a sharp stabbing pain that only last for several seconds.  Reports that it may come and go but then it will dissipate.  These are not happening daily but does happen on a weekly basis.  She continues on gabapentin and Depakote.  Reports that she is consistent with her medications.  She states in regards to fibromyalgia she is having increased pain all over.  She continues to have trouble with her memory.  She states that she has a hard time concentrating while driving.  Reports a lot of fatigue.  Reports that she has numbness and tingling in both arms and legs intermittently.  She reports that her mood has been fluctuating.  Denies any thoughts of suicide or thoughts of harming others.  She returns today for follow-up.  HISTORY Joy Taylor is a 22 year old left-handed white female with an impressive list of medical problems at such a young age.  The patient has a history of Ehlers-Danlos syndrome, she claims that this was diagnosed through a Dietitian in [place].  The patient has a history of migraine headaches over the last 3 or 4 years, she has moved from [place] area to [place], she indicates that her neurologist started her on Depakote for her migraine which has been helpful.  The patient indicates that since she has started methotrexate for her lupus and rheumatoid arthritis her migraine headaches have become more frequent, occurring 2 or 3 times a week.  The headaches  may be in the back of the head or around the eyes, not associated with nausea or vomiting, but the patient claims to have chronic nausea issues.  She is followed through a gastroenterologist for this.  The patient reports photophobia and phonophobia.  She indicates that stress will increase her headaches but she does not note any other activating factors.  She denies any family history of headache.  The patient claims that she was given the diagnosis of fibromyalgia 2 years ago with trigger points throughout the body including the neck, shoulders, back, hips, thighs.  The patient however also carries the diagnosis of rheumatoid arthritis and lupus, she is on Plaquenil and methotrexate.  She is followed through rheumatology at this time.  The patient has been treated with gabapentin currently on 300 mg 4 times daily with some benefit, but the fibromyalgia pain has worsened over the last 6 months to year.  In the past, the patient has been on Cymbalta but had cognitive side effects on this medication.  The patient reports memory problems as well.  She has word finding problems, she has chronic fatigue.  She does not sleep well at night.  The patient has difficulty recognizing faces of people that she knows.  She reports some numbness on the left side of the body.  She has a history of anxiety and depression.  She has irritable bowel syndrome as well, she has recently had arthroscopic surgery on her right knee.   REVIEW OF SYSTEMS:  Out of a complete 14 system review of symptoms, the patient complains only of the following symptoms, and all other reviewed systems are negative.  Restless leg, insomnia, joint pain, joint swelling, back pain, aching muscles, muscle cramps, agitation, confusion, decreased concentration, depression, nervous/anxious, memory loss, dizziness, headache, weakness, abdominal pain, nausea, heat intolerance, eye itching, double vision, eye pain, blurred vision, fatigue, ear pain, ringing in  ears  ALLERGIES: No Known Allergies  HOME MEDICATIONS: Outpatient Medications Prior to Visit  Medication Sig Dispense Refill  . acetaminophen (TYLENOL) 325 MG tablet Take 2 tablets (650 mg total) by mouth every 6 (six) hours as needed. Do not take more than 4000mg  of tylenol per day 30 tablet 0  . aspirin-acetaminophen-caffeine (EXCEDRIN MIGRAINE) 250-250-65 MG tablet Take 1 tablet by mouth every 6 (six) hours as needed for headache.    . busPIRone (BUSPAR) 15 MG tablet Take 1 tablet (15 mg total) by mouth at bedtime. 30 tablet 3  . divalproex (DEPAKOTE) 500 MG DR tablet Take 1 tablet (500 mg total) by mouth 2 (two) times daily. Must keep pending appt. with Clabe Seal, NP on 10/11/18, arrival time of 11am 60 tablet 1  . gabapentin (NEURONTIN) 600 MG tablet Take 1 tablet (600 mg total) by mouth 3 (three) times daily. 270 tablet 3  . Multiple Vitamins-Minerals (MULTIVITAMIN WOMEN PO) Take 1 tablet by mouth daily.    . Norethindrone Acetate-Ethinyl Estradiol (MICROGESTIN) 1.5-30 MG-MCG tablet Take 1 tablet by mouth daily. 3 Package 3  . ondansetron (ZOFRAN ODT) 4 MG disintegrating tablet Take 1 tablet (4 mg total) by mouth every 8 (eight) hours as needed for nausea or vomiting. 60 tablet 3  . pilocarpine (SALAGEN) 5 MG tablet Take 5 mg by mouth 4 (four) times daily as needed (for dry mouth).    . AMBULATORY NON FORMULARY MEDICATION Take 1 tablet by mouth as needed. oxycotin 5mg     . AMBULATORY NON FORMULARY MEDICATION Medication Name: FDgard: Take with meals    . famotidine (PEPCID) 20 MG tablet Take 1 tablet (20 mg total) by mouth 2 (two) times daily as needed for heartburn or indigestion. 60 tablet 3  . fluticasone (FLONASE) 50 MCG/ACT nasal spray Place 1 spray into both nostrils daily.    Marland Kitchen FOLIC ACID PO Take 1 tablet by mouth daily.    . hydrocortisone 1 % ointment Apply 1 application topically 2 (two) times daily. Apply to hemorrhoid x 1 week, repeat x 1 week if needed. 30 g 0  . Ibuprofen  (IBU PO) Take 3 tablets by mouth 2 (two) times daily.    . methotrexate 50 MG/2ML injection TAKE 0.4ML ONCE WEEKLY 28 DAYS  2  . omeprazole (PRILOSEC) 40 MG capsule Take 40 mg by mouth daily.     No facility-administered medications prior to visit.     PAST MEDICAL HISTORY: Past Medical History:  Diagnosis Date  . Anxiety   . Depression   . Ehlers-Danlos disease   . Fibromyalgia   . Genital herpes    diagnosed by GYN-per patient no treatment advised unless daily flare ups occur  . GERD (gastroesophageal reflux disease)   . IBS (irritable bowel syndrome)   . Kidney stones   . Low BP    post-op  . Lupus ([place])   . Migraine   . Rheumatoid arthritis ([place])   . Stomach problems     PAST SURGICAL HISTORY: Past Surgical History:  Procedure Laterality Date  . HIP ARTHROSCOPY Right 07/15/2016, 08/06/2015  . KNEE ARTHROSCOPY  Right   . NISSEN FUNDOPLICATION  09/[id number]/[id number]  . ROTATOR CUFF REPAIR Right 2010  . SHOULDER ARTHROSCOPY Right 06/29/2018    FAMILY HISTORY: Family History  Problem Relation Age of Onset  . Diabetes Mother   . Diverticulitis Mother   . Colon polyps Mother   . Macular degeneration Maternal Grandmother   . Heart disease Maternal Grandmother   . Hypertension Maternal Grandfather   . Heart disease Maternal Grandfather   . Bone cancer Father   . Asthma Other   . Obesity Other   . Heart disease Other   . Colon cancer Neg Hx   . Esophageal cancer Neg Hx   . Pancreatic cancer Neg Hx   . Stomach cancer Neg Hx   . Liver disease Neg Hx   . Rectal cancer Neg Hx     SOCIAL HISTORY: Social History   Socioeconomic History  . Marital status: Single    Spouse name: Not on file  . Number of children: 0  . Years of education: 13  . Highest education level: Not on file  Occupational History  . Occupation: Optometrist  Social Needs  . Financial resource strain: Not on file  . Food insecurity:    Worry: Not on file    Inability: Not on file  . Transportation  needs:    Medical: Not on file    Non-medical: Not on file  Tobacco Use  . Smoking status: Never Smoker  . Smokeless tobacco: Never Used  Substance and Sexual Activity  . Alcohol use: Yes    Comment: rare-occ. per pt  . Drug use: No  . Sexual activity: Yes  Lifestyle  . Physical activity:    Days per week: Not on file    Minutes per session: Not on file  . Stress: Not on file  Relationships  . Social connections:    Talks on phone: Not on file    Gets together: Not on file    Attends religious service: Not on file    Active member of club or organization: Not on file    Attends meetings of clubs or organizations: Not on file    Relationship status: Not on file  . Intimate partner violence:    Fear of current or ex partner: Not on file    Emotionally abused: Not on file    Physically abused: Not on file    Forced sexual activity: Not on file  Other Topics Concern  . 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 Reports this triggered all of her health problems.      PHYSICAL EXAM  Vitals:   10/11/18 1137  BP: 103/63  Pulse: 68  Weight: 154 lb 12.8 oz (70.2 kg)  Height: 5\' 5"  (1.651 m)   Body mass index is 25.76 kg/m.  Generalized: Well developed, in no acute distress   Neurological examination  Mentation: Alert oriented to time, place, history taking. Follows all commands speech and language fluent Cranial nerve II-XII: Pupils were equal round reactive to light. Extraocular movements were full, visual field were full on confrontational test. Facial sensation and strength were normal. Uvula tongue midline. Head turning and shoulder shrug  were normal and symmetric. Motor: The motor testing  reveals 5 over 5 strength of all 4 extremities. Good symmetric motor tone is noted  throughout.  Sensory: Sensory testing is intact to soft touch on all 4 extremities. No evidence of extinction is noted.  Coordination: Cerebellar testing reveals good finger-nose-finger and heel-to-shin bilaterally.  Gait and station: Gait is normal. Tandem gait is normal. Romberg is negative. No drift is seen.  Reflexes: Deep tendon reflexes are symmetric and normal bilaterally.   DIAGNOSTIC DATA (LABS, IMAGING, TESTING) - I reviewed patient records, labs, notes, testing and imaging myself where available.     ASSESSMENT AND PLAN 22 y.o. year old female  has a past medical history of Anxiety, Depression, Ehlers-Danlos disease, Fibromyalgia, Genital herpes, GERD (gastroesophageal reflux disease), IBS (irritable bowel syndrome), Kidney stones, Low BP, Lupus ([place]), Migraine, Rheumatoid arthritis ([place]), and Stomach problems. here with:  1.  Migraine headaches 2.  Fibromyalgia 3. Memory loss  The patient will continue on gabapentin and Depakote.  I will check blood work today.  The patient has never scheduled for MRI of the brain.  I advised that we should get this scheduled to evaluate her headaches as well as paresthesias.  The patient is having a new type of headache on the left side of the face.  We will wait for MRI of the brain.  In the future we may increase her gabapentin or consider any medication.  She returns today for evaluation.     Ward Givens, MSN, NP-C 10/11/2018, 11:39 AM [facility] [address] [place], [place] [address] [phone]

## 2018-10-11 NOTE — Progress Notes (Signed)
I have read the note, and I agree with the clinical assessment and plan.  Chipper Koudelka K Chiquetta Langner   

## 2018-10-11 NOTE — Patient Instructions (Signed)
Your Plan:  Continue gabapentin and depakote Schedule MRI If your symptoms worsen or you develop new symptoms please let us know.   Thank you for coming to see [place] at [facility]. I hope we have been able to provide you high quality care today.  You may receive a patient satisfaction survey over the next few weeks. We would appreciate your feedback and comments so that we may continue to improve ourselves and the health of our patients.

## 2018-10-12 ENCOUNTER — Telehealth: Payer: Self-pay | Admitting: *Deleted

## 2018-10-12 LAB — COMPREHENSIVE METABOLIC PANEL
ALT: 9 IU/L (ref 0–32)
AST: 17 IU/L (ref 0–40)
Albumin/Globulin Ratio: 1.7 (ref 1.2–2.2)
Albumin: 4.3 g/dL (ref 3.5–5.5)
Alkaline Phosphatase: 34 IU/L — ABNORMAL LOW (ref 39–117)
BUN/Creatinine Ratio: 19 (ref 9–23)
BUN: 13 mg/dL (ref 6–20)
Bilirubin Total: 0.3 mg/dL (ref 0.0–1.2)
CO2: 20 mmol/L (ref 20–29)
Calcium: 9.2 mg/dL (ref 8.7–10.2)
Chloride: 104 mmol/L (ref 96–106)
Creatinine, Ser: 0.7 mg/dL (ref 0.57–1.00)
GFR calc Af Amer: 140 mL/min/{1.73_m2} (ref >59)
GFR calc non Af Amer: 122 mL/min/{1.73_m2} (ref >59)
Globulin, Total: 2.5 g/dL (ref 1.5–4.5)
Glucose: 76 mg/dL (ref 65–99)
Potassium: 4 mmol/L (ref 3.5–5.2)
Sodium: 139 mmol/L (ref 134–144)
Total Protein: 6.8 g/dL (ref 6.0–8.5)

## 2018-10-12 LAB — AMMONIA: Ammonia: 50 ug/dL (ref 19–87)

## 2018-10-12 LAB — VALPROIC ACID LEVEL: Valproic Acid Lvl: 54 ug/mL (ref 50–100)

## 2018-10-12 NOTE — Telephone Encounter (Signed)
Spoke to patient - she is aware of her lab results. 

## 2018-10-12 NOTE — Telephone Encounter (Signed)
-----   Message from Ward Givens, NP sent at 10/12/2018  3:37 PM EST ----- Blood work is relatively unremarkable.  Please call patient

## 2018-10-20 ENCOUNTER — Telehealth: Payer: Self-pay | Admitting: Gastroenterology

## 2018-10-20 NOTE — Telephone Encounter (Signed)
Patient notified

## 2018-10-20 NOTE — Telephone Encounter (Signed)
This needs to go to [facility], she is covering [employer] this week.

## 2018-10-20 NOTE — Telephone Encounter (Signed)
She can resume the Pepcid as you recommended first and see if it helps. If not, then can take the FD gard. Otherwise hopefully she is taking the buspirone, this has helped her in the past but we did change her dose. Thanks

## 2018-10-20 NOTE — Telephone Encounter (Signed)
Patient reports chest pain - epigastric and RUQ.  She states pain is worse after meals.  She has done some dietary modifications with no improvement.  Questioned if the [facility] that was advised at the last OV helped or if the pepcid helped.  She "can't remember".  She has a follow up with PA next week.  She is asking about tx until OV.  She is advised to resume Pepcid 20 mg at least twice a day until Dr. Havery Moros has the opportunity to answer.

## 2018-10-27 ENCOUNTER — Encounter: Payer: Self-pay | Admitting: Physician Assistant

## 2018-10-27 ENCOUNTER — Ambulatory Visit (INDEPENDENT_AMBULATORY_CARE_PROVIDER_SITE_OTHER): Payer: BLUE CROSS/BLUE SHIELD | Admitting: Physician Assistant

## 2018-10-27 VITALS — BP 102/64 | HR 65 | Ht 65.0 in | Wt 153.2 lb

## 2018-10-27 DIAGNOSIS — R1013 Epigastric pain: Secondary | ICD-10-CM

## 2018-10-27 DIAGNOSIS — Z9889 Other specified postprocedural states: Secondary | ICD-10-CM | POA: Diagnosis not present

## 2018-10-27 DIAGNOSIS — K9289 Other specified diseases of the digestive system: Secondary | ICD-10-CM | POA: Diagnosis not present

## 2018-10-27 MED ORDER — AMBULATORY NON FORMULARY MEDICATION: 0 refills | Status: DC

## 2018-10-27 MED ORDER — ESOMEPRAZOLE MAGNESIUM 20 MG PO CPDR: 20.0000 mg | DELAYED_RELEASE_CAPSULE | Freq: Two times a day (BID) | ORAL | 3 refills | Status: DC

## 2018-10-27 NOTE — Progress Notes (Signed)
Agree with assessment and plan as outlined.  

## 2018-10-27 NOTE — Progress Notes (Signed)
Chief Complaint: Chest pain and nausea  HPI:    Joy Taylor is a 22 year old female, known to Dr. Havery Moros, with a history of [place], RA/SLE and history of reflux who underwent Nissen fundoplication in 04/2946, who presents to clinic today for complaint of chest pain and nausea.    08/31/2018 office visit with Dr. Havery Moros.  At that time it was discussed that prior to [facility] 11/2016 she had reported severe regurgitation of gastric contents refractory to medications which led to surgery.  It worked to treat her reflux but she continued with significant symptoms with postprandial abdominal pain and bloating.  Last EGD in May of this year which showed normal postsurgical anatomy, biopsies negative for H. pylori.  Gastric emptying study at the end of May which was also normal.  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  She had trials of PPIs and Reglan's in the past without benefit.  She was given a trial buspirone dose to 15 mg once daily which provided her some benefit and was increased to 30 mg.  She reported feeling better after this.  Apparently describes some side effects including "head shakiness" and blurred vision thought related to this medicine.  At that time her buspirone was decreased to 15 mg once at night to see if she tolerated it better.  Is also recommended she follow-up with her neurologist for the symptoms.  She was switched from Zantac to Pepcid 20 mg as needed.  Also given a trial of FD guard with meals to see if that helped.  It was discussed that if she did not tolerate Buspirone then could consider Remeron.  There was also question of hypoglycemia and patient was going to monitor blood glucose closely at home.    10/20/18 called and reported chest pain epigastric and right upper quadrant worse after meals.  At that time she was told to increase her Pepcid to 20 mg twice a day.  It was discussed that this did not  help and she could take FD guard.  It was also recommended she continue her Buspirone 15 mg nightly.    Today, the patient presents to clinic and tells me that she went into a "flare" last Monday, 10/17/2018.  She thinks this may be related to an increased stress level.  Since then she has added in Pepcid 20 mg twice daily and restarted her FD guard 3 times daily along with her Buspirone 15 mg at night.  This has been helping some but she does continue with a sharp "chest pain/squeezing in the middle of my chest" which happens randomly throughout the day and can last for 15 minutes at a time, prior to adding back in these medicines it was constant.  Sometimes this is very severe and occasionally wakes her from her sleep.    Patient does report that she previously did yoga which seemed to help with her stress level and her stomach symptoms.    Denies fever, chills, weight loss, change in bowel habits, nausea or vomiting.  EGD 03/18/2018 - normal, h/o Nissen fundoplication - bx negative for HP GES 04/14/18 - normal   Prior workup.  CT abdomen without contrast 03/02/2017 - normal Gastric emptying study 09/01/16 - "mildly delayed" emptying [place] RUQ 05/12/2016 - normal UGI series on 03/05/2017 - no abnormality reported, surgical site looked okay.  Past Medical History:  Diagnosis Date  . Anxiety   . Depression   .  Ehlers-Danlos disease   . Fibromyalgia   . Genital herpes    diagnosed by GYN-per patient no treatment advised unless daily flare ups occur  . GERD (gastroesophageal reflux disease)   . IBS (irritable bowel syndrome)   . Kidney stones   . Low BP    post-op  . Lupus ([place])   . Migraine   . Rheumatoid arthritis ([place])   . Stomach problems     Past Surgical History:  Procedure Laterality Date  . HIP ARTHROSCOPY Right 07/15/2016, 08/06/2015  . KNEE ARTHROSCOPY Right   . NISSEN FUNDOPLICATION  85/12/7739  . ROTATOR CUFF REPAIR Right 2010  . SHOULDER ARTHROSCOPY Right 06/29/2018     Current Outpatient Medications  Medication Sig Dispense Refill  . acetaminophen (TYLENOL) 325 MG tablet Take 2 tablets (650 mg total) by mouth every 6 (six) hours as needed. Do not take more than 4000mg  of tylenol per day 30 tablet 0  . aspirin-acetaminophen-caffeine (EXCEDRIN MIGRAINE) 250-250-65 MG tablet Take 1 tablet by mouth every 6 (six) hours as needed for headache.    . busPIRone (BUSPAR) 15 MG tablet Take 1 tablet (15 mg total) by mouth at bedtime. 30 tablet 3  . divalproex (DEPAKOTE) 500 MG DR tablet Take 1 tablet (500 mg total) by mouth 2 (two) times daily. Must keep pending appt. with Clabe Seal, NP on 10/11/18, arrival time of 11am 60 tablet 1  . gabapentin (NEURONTIN) 600 MG tablet Take 1 tablet (600 mg total) by mouth 3 (three) times daily. 270 tablet 3  . Multiple Vitamins-Minerals (MULTIVITAMIN WOMEN PO) Take 1 tablet by mouth daily.    . Norethindrone Acetate-Ethinyl Estradiol (MICROGESTIN) 1.5-30 MG-MCG tablet Take 1 tablet by mouth daily. 3 Package 3  . ondansetron (ZOFRAN ODT) 4 MG disintegrating tablet Take 1 tablet (4 mg total) by mouth every 8 (eight) hours as needed for nausea or vomiting. (Patient not taking: Reported on 10/11/2018) 60 tablet 3  . pilocarpine (SALAGEN) 5 MG tablet Take 5 mg by mouth 4 (four) times daily as needed (for dry mouth).     No current facility-administered medications for this visit.     Allergies as of 10/27/2018  . (No Known Allergies)    Family History  Problem Relation Age of Onset  . Diabetes Mother   . Diverticulitis Mother   . Colon polyps Mother   . Macular degeneration Maternal Grandmother   . Heart disease Maternal Grandmother   . Hypertension Maternal Grandfather   . Heart disease Maternal Grandfather   . Bone cancer Father   . Asthma Other   . Obesity Other   . Heart disease Other   . Colon cancer Neg Hx   . Esophageal cancer Neg Hx   . Pancreatic cancer Neg Hx   . Stomach cancer Neg Hx   . Liver disease Neg Hx    . Rectal cancer Neg Hx     Social History   Socioeconomic History  . Marital status: Single    Spouse name: Not on file  . Number of children: 0  . Years of education: 105  . Highest education level: Not on file  Occupational History  . Occupation: Optometrist  Social Needs  . Financial resource strain: Not on file  . Food insecurity:    Worry: Not on file    Inability: Not on file  . Transportation needs:    Medical: Not on file    Non-medical: Not on file  Tobacco Use  . Smoking status: Never  Smoker  . Smokeless tobacco: Never Used  Substance and Sexual Activity  . Alcohol use: Yes    Comment: rare-occ. per pt  . Drug use: No  . Sexual activity: Yes  Lifestyle  . Physical activity:    Days per week: Not on file    Minutes per session: Not on file  . Stress: Not on file  Relationships  . Social connections:    Talks on phone: Not on file    Gets together: Not on file    Attends religious service: Not on file    Active member of club or organization: Not on file    Attends meetings of clubs or organizations: Not on file    Relationship status: Not on file  . Intimate partner violence:    Fear of current or ex partner: Not on file    Emotionally abused: Not on file    Physically abused: Not on file    Forced sexual activity: Not on file  Other Topics Concern  . 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 Reports this triggered all of her health problems.    Review of Systems:    Constitutional: No weight loss, fever or chills Cardiovascular: +atypical chest pain Respiratory: No SOB  Gastrointestinal: See HPI and otherwise negative   Physical Exam:  Vital signs: BP 102/64   Pulse 65   Ht 5\' 5"  (1.651 m)   Wt 153 lb 3.2 oz (69.5 kg)   BMI 25.49 kg/m    Constitutional:   Pleasant Caucasian female appears to be in NAD, Well developed, Well nourished, alert and cooperative Respiratory: Respirations even and unlabored. Lungs clear to auscultation bilaterally.   No wheezes, crackles, or rhonchi.  Cardiovascular: Normal S1, S2. No MRG. Regular rate and rhythm. No peripheral edema, cyanosis or pallor.  Gastrointestinal:  Soft, nondistended, moderate epigastric ttp, No rebound or guarding. Normal bowel sounds. No appreciable masses or hepatomegaly. Psychiatric: Demonstrates good judgement and reason without abnormal affect or behaviors.  MOST RECENT LABS:  CMP     Component Value Date/Time   NA 139 10/11/2018 1215   K 4.0 10/11/2018 1215   CL 104 10/11/2018 1215   CO2 20 10/11/2018 1215   GLUCOSE 76 10/11/2018 1215   BUN 13 10/11/2018 1215   CREATININE 0.70 10/11/2018 1215   CALCIUM 9.2 10/11/2018 1215   PROT 6.8 10/11/2018 1215   ALBUMIN 4.3 10/11/2018 1215   AST 17 10/11/2018 1215   ALT 9 10/11/2018 1215   ALKPHOS 34 (L) 10/11/2018 1215   BILITOT 0.3 10/11/2018 1215   GFRNONAA 122 10/11/2018 1215   GFRAA 140 10/11/2018 1215    Assessment: 1.  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  Prescribed GI cocktail 5-10 mL's every 4 hours as needed for symptoms which seem to only  last for 15 minutes at a time 2.  Added Nexium 20 mg twice daily #60 with 3 refills 3.  Continue FD guard 3 times daily before meals and Pepcid 20 mg twice daily 4.  Continue Buspirone 15 mg nightly 5.  Encouraged  the patient to perhaps restart yoga as this seemed to help with her stress level and her abdominal symptoms 6.  Patient will follow with Dr. Havery Moros in the next 1 to 2 months or sooner if needed  Ellouise Newer, PA-C [facility] 10/27/2018, 8:25 AM  Cc: Lucretia Kern, DO

## 2018-10-27 NOTE — Patient Instructions (Addendum)
If you are age 24 or older, your body mass index should be between 23-30. Your Body mass index is 25.49 kg/m. If this is out of the aforementioned range listed, please consider follow up with your Primary Care Provider.  If you are age 30 or younger, your body mass index should be between 19-25. Your Body mass index is 25.49 kg/m. If this is out of the aformentioned range listed, please consider follow up with your Primary Care Provider.   We have sent the following medications to your pharmacy for you to pick up at your convenience: GI Cocktail Nexium  Continue Pepcid 20 mg twice daily.  Follow up with Dr. Havery Moros on December 06, 2018 at 8:45 am.  Thank you for choosing me and [facility].  Ellouise Newer, PA-C

## 2018-11-01 ENCOUNTER — Encounter: Payer: BLUE CROSS/BLUE SHIELD | Attending: Family Medicine | Admitting: Registered"

## 2018-11-01 ENCOUNTER — Encounter: Payer: Self-pay | Admitting: Registered"

## 2018-11-01 DIAGNOSIS — Z713 Dietary counseling and surveillance: Secondary | ICD-10-CM | POA: Diagnosis not present

## 2018-11-01 DIAGNOSIS — R635 Abnormal weight gain: Secondary | ICD-10-CM | POA: Diagnosis present

## 2018-11-01 NOTE — Progress Notes (Signed)
  Medical Nutrition Therapy:  Appt start time: 2:35 end time:  3:05.  Prefers to be called "Alyssa" Assessment:  Primary concerns today:  Pt expectations: to be healthier, guidance on what to eat, and how much   Pt reports GI history of IBS, dyspepsia, Nissen fundoplication.   Pt states she has had a stomach flare-up for the last 2 weeks and it feels really bad; taking medications to help. Pt states she is also taking a medicine to help with RA. Pt states due to recent flare-up her stomach hurts with most items she eats. Pt states she notices that it hurts less when grazing: nuts, banana chips, yogurt, fruit, and some vegetables. Pt states she it's hard to eat meals without feeling discomfort. Pt states she is against eating disorders because she saw how if affected her brother for years.   Pt states new medications have been causing her to feel tired and weird; less hungry. Pt states she has also been stressed due to school, family, and finances; plans to attend yoga soon. Pt states she is taking a break from seeing therapist while school is on break for semester.    Pt reports triggers for stomach discomfort: meat, dairy, and eating too quickly   Preferred Learning Style:   No preference indicated   Learning Readiness:   Contemplating  Ready  Change in progress   MEDICATIONS: See list   DIETARY INTAKE:  Usual eating pattern includes 3 meals and 2-3 snacks per day.  Everyday foods include meat substitutes, beans, dairy-free yogurt, salad, fruit, and soup. Avoided foods include meat and limits dairy.    24-hr recall:  B ( AM): sometimes skips; dairy-free yogurt or peanut butter protein bar Snk ( AM): none  L ( PM): 1/2 veggie sub on wheat bread  Snk ( PM): banana chips D ( PM): greek salad + hummus + crackers  Snk ( PM): none Beverages: water sometimes with crystal light (60-70 oz)  Usual physical activity: plans to begin jujitsu; gym cardio + light weights + strength  training 60-90 min, 3 days/week  Estimated energy needs: 2000 calories 225 g carbohydrates 150 g protein 56 g fat  Progress Towards Goal(s):  In progress.   Nutritional Diagnosis:  NB-1.1 Food and nutrition-related knowledge deficit As related to lack of prior nutrition-related information.  As evidenced by verbalizes incomplete information.    Intervention:  Nutrition education and counseling. Pt was educated and counseled on the importance of eating and giving our bodies nourishment throughout the day and reducing stress. Pt was educated on the reasons of not eating within 2-3 hours of going to bed. Pt was also encouraged related to behavioral changes from previous visit. Pt was in agreement with goals listed.  Goals: - Aim to eat more frequently.  - Continue to reduce stress.  - Can eat after 8 pm. Limit to within 2-3 hours of going to bed.   Teaching Method Utilized:  Visual Auditory Hands on  Handouts given during visit include:  none  Barriers to learning/adherence to lifestyle change: school-work-life balance  Demonstrated degree of understanding via:  Teach Back   Monitoring/Evaluation:  Dietary intake, exercise, and body weight in 1 month(s).

## 2018-11-01 NOTE — Patient Instructions (Addendum)
-   Aim to eat more frequently.   - Continue to reduce stress.   - Can eat after 8 pm. Limit to within 2-3 hours of going to bed.

## 2018-11-04 ENCOUNTER — Telehealth: Payer: Self-pay | Admitting: *Deleted

## 2018-11-04 ENCOUNTER — Other Ambulatory Visit: Payer: Self-pay | Admitting: *Deleted

## 2018-11-04 MED ORDER — OMEPRAZOLE 20 MG PO CPDR: DELAYED_RELEASE_CAPSULE | ORAL | 3 refills | Status: DC

## 2018-11-04 NOTE — Telephone Encounter (Signed)
Called the patient to advise per Ellouise Newer PA, we sent a prescription to [facility], Omeprazole 20 mg twice daily.  Also she is to keep taking the Pepcid 20 mg twice daily. The patient has verbalized understanding these instructions.

## 2018-11-04 NOTE — Telephone Encounter (Signed)
Patien't insurance does not want to cover twice daily Esomeprazole 20 mg.  Per Anderson Malta the patient needs something twice daily.  We sent Omeprazole 20 mg twice daily to [facility].Will call the patient to advise.

## 2018-11-17 ENCOUNTER — Other Ambulatory Visit: Payer: Self-pay | Admitting: *Deleted

## 2018-11-17 ENCOUNTER — Telehealth: Payer: Self-pay | Admitting: *Deleted

## 2018-11-17 MED ORDER — OMEPRAZOLE 40 MG PO CPDR: DELAYED_RELEASE_CAPSULE | ORAL | 6 refills | Status: DC

## 2018-11-17 NOTE — Telephone Encounter (Signed)
Information to add, The patient is also taking Pepcid 20 mg twice daily along with the Omeprazole 40 mg, once daily, the new prescription.

## 2018-11-17 NOTE — Telephone Encounter (Signed)
Called Ellouise Newer PA to advise [employer] does not want to cover the script of Omeprazole 20 mg twice daily.Anderson Malta suggested I called the patient and ask how she is doing and is is ok if she wants to try the 40 mg once daily.)  They are suggesting Omeprazole 40 mg once daily.  I called the patient and she is taking the Pepcid 20 mg twice daily. She said she would try the Omeprzole 40 mg once daily and let Dr. Havery Moros know on 12-06-2018 when she seems him how she is doing with that dose.

## 2018-11-28 ENCOUNTER — Encounter: Payer: Self-pay | Admitting: Registered"

## 2018-11-28 ENCOUNTER — Encounter: Payer: BLUE CROSS/BLUE SHIELD | Attending: Family Medicine | Admitting: Registered"

## 2018-11-28 DIAGNOSIS — Z713 Dietary counseling and surveillance: Secondary | ICD-10-CM | POA: Diagnosis not present

## 2018-11-28 DIAGNOSIS — R635 Abnormal weight gain: Secondary | ICD-10-CM | POA: Insufficient documentation

## 2018-11-28 NOTE — Patient Instructions (Addendum)
-   Add in breakfast option including protein + carbohydrates.   - Continue to try to increase food intake throughout the day.

## 2018-11-28 NOTE — Progress Notes (Signed)
  Medical Nutrition Therapy:  Appt start time: 3:08 end time:  3:38.  Prefers to be called "Joy Taylor" Assessment:  Primary concerns today:  Pt expectations: to be healthier, guidance on what to eat, and how much   Pt reports GI history of IBS, dyspepsia, Nissen fundoplication.   Pt states she is taking new medication; makes her tired. Pt states she smells and tastes rubbing alcohol and it makes food less desirable. Pt states she has been sleeping about 13 hours a day due to being tired, eating 1 meal a day. Pt reports dizziness sometimes, back pain for the past week, some focus/concentration struggles she states due to tiredness, a rash on her chest/neck areas, and rarely heart racing/chest pains. Pt states she has a bowel movement every 1-2 days.   Pt states it has been beneficial for her to eat smaller meals to decrease abdominal discomfort. Pt states she is moving next month into a new apartment.   Pt states she is against eating disorders because she saw how if affected her brother for years. Pt states she has also been stressed due to school, family, and finances; plans to attend yoga soon. Pt states she plans to reschedule appt with therapist soon.   Pt reports triggers for stomach discomfort: meat, dairy, and eating too quickly   Preferred Learning Style:   No preference indicated   Learning Readiness:   Contemplating  Ready  Change in progress   MEDICATIONS: See list   DIETARY INTAKE:  Usual eating pattern includes 3 meals and 2-3 snacks per day.  Everyday foods include meat substitutes, beans, dairy-free yogurt, salad, fruit, and soup. Avoided foods include meat, bread, and limits dairy.    24-hr recall:  B ( AM): skips S (AM): none L ( PM): skips Snk ( PM): fruit or almond milk yogurt or wheat thins D ( PM): beans + rice + substitute meat or substitute meat + rice + fruit  Snk ( PM): sometimes fruit or almond milk yogurt or wheat thins Beverages: water sometimes  with crystal light (60-70 oz)  Usual physical activity: plans to begin jujitsu; gym cardio + light weights + strength training 60-90 min, 3 days/week  Estimated energy needs: 2000 calories 225 g carbohydrates 150 g protein 56 g fat  Progress Towards Goal(s):  In progress.   Nutritional Diagnosis:  NB-1.1 Food and nutrition-related knowledge deficit As related to lack of prior nutrition-related information.  As evidenced by verbalizes incomplete information.    Intervention:  Nutrition education and counseling. Pt was educated and counseled on the importance of eating and giving our bodies nourishment throughout the day. Pt was in agreement with goals listed.  Goals: - Add in breakfast option including protein + carbohydrates.  - Continue to try to increase food intake throughout the day.   Teaching Method Utilized:  Visual Auditory Hands on  Handouts given during visit include:  none  Barriers to learning/adherence to lifestyle change: school-work-life balance  Demonstrated degree of understanding via:  Teach Back   Monitoring/Evaluation:  Dietary intake, exercise, and body weight in 1 month(s).

## 2018-12-06 ENCOUNTER — Ambulatory Visit (INDEPENDENT_AMBULATORY_CARE_PROVIDER_SITE_OTHER): Payer: BLUE CROSS/BLUE SHIELD | Admitting: Gastroenterology

## 2018-12-06 ENCOUNTER — Encounter: Payer: Self-pay | Admitting: Gastroenterology

## 2018-12-06 VITALS — BP 94/66 | HR 68 | Ht 65.0 in | Wt 154.0 lb

## 2018-12-06 DIAGNOSIS — R1013 Epigastric pain: Secondary | ICD-10-CM | POA: Diagnosis not present

## 2018-12-06 DIAGNOSIS — K9289 Other specified diseases of the digestive system: Secondary | ICD-10-CM

## 2018-12-06 DIAGNOSIS — K219 Gastro-esophageal reflux disease without esophagitis: Secondary | ICD-10-CM | POA: Diagnosis not present

## 2018-12-06 MED ORDER — OMEPRAZOLE 40 MG PO CPDR: 40.0000 mg | DELAYED_RELEASE_CAPSULE | Freq: Every day | ORAL | 3 refills | Status: DC

## 2018-12-06 MED ORDER — SUCRALFATE 1 GM/10ML PO SUSP: 1.0000 g | Freq: Three times a day (TID) | ORAL | 1 refills | Status: DC | PRN

## 2018-12-06 MED ORDER — ONDANSETRON 4 MG PO TBDP: 4.0000 mg | ORAL_TABLET | Freq: Four times a day (QID) | ORAL | 3 refills | Status: DC | PRN

## 2018-12-06 NOTE — Progress Notes (Signed)
HPI :  23 y/o female here for a follow up visit. Since our last visit she has been taking Buspirone 15mg  q HS (did not tolerated 30mg  dosing), stopped zantac, switched to Pepcid, and recommend she start nexium 20mg  / day. She has a history of Ehlers'Danlos syndrome, RA / SLE,and history of refluxwho had Nissen fundoplication in 01/8100,BP that time she reported severe regurgitation of gastric contents refractory to medicationswhich led to the surgery. While the surgery worked too treat her reflux, she had significant symptoms with postprandial abdominal pain and bloating. She's had evaluation with [place] since that time. She's had a prior CT scan which has not showed any pathology. She had an EGD with me in May 2019 which showed normal postsurgical anatomy, biopsies negative for H. Pylori. She a gastric emptying study at the end of May as well which was normal.  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 She's had trials Reglan in the past without benefit.  She reports the Buspirone has provided help for her dyspepsia. Her stomach has responded positively with this and she does not have nearly as much of the postprandial stomach pains that she used to. She now endorses feeling some burning in her chest with lower chest discomfort, and occasional vomiting. She feels that eating acidic foods and tomatoes can also make her symptoms worse. She was also started on Sims for her rheumatoid arthritis and sent last seen her, on this for the past 5 weeks and stopped or methotrexate. She thinks this is helping her rheumatoid arthritis. She has been taking pepcid twice daily. Given a prescription for nexium 20mg  which she has not taken, and does not appear to be taking omeprazole which she had been given in the past.   EGD 03/18/2018 - normal, h/o Nissen fundoplication - bx negative for HP GES 04/14/18 - normal  Prior workup. CT abdomen without contrast  03/02/2017 - normal Gastric emptying study 09/01/16 - "mildly delayed" emptying [place] RUQ 05/12/2016 - normal UGI series on 03/05/2017- no abnormality reported, surgical site looked okay.     Past Medical History:  Diagnosis Date  . Anxiety   . Depression   . Ehlers-Danlos disease   . Fibromyalgia   . Genital herpes    diagnosed by GYN-per patient no treatment advised unless daily flare ups occur  . GERD (gastroesophageal reflux disease)   . IBS (irritable bowel syndrome)   . Kidney stones   . Low BP    post-op  . Lupus ([place])   . Migraine   . Rheumatoid arthritis ([place])   . Stomach problems      Past Surgical History:  Procedure Laterality Date  . HIP ARTHROSCOPY Right 07/15/2016, 08/06/2015  . KNEE ARTHROSCOPY Right   . NISSEN FUNDOPLICATION  09/09/8526  . ROTATOR CUFF REPAIR Right 2010  . SHOULDER ARTHROSCOPY Right 06/29/2018   Family History  Problem Relation Age of Onset  . Diabetes Mother   . Diverticulitis Mother   . Colon polyps Mother   . Macular degeneration Maternal Grandmother   . Heart disease Maternal Grandmother   . Hypertension Maternal Grandfather   . Heart disease Maternal Grandfather   . Bone cancer Father   . Asthma Other   . Obesity Other   . Heart disease Other   . Colon cancer Neg Hx   . Esophageal cancer Neg Hx   . Pancreatic cancer Neg Hx   . Stomach cancer Neg Hx   .  Liver disease Neg Hx   . Rectal cancer Neg Hx    Social History   Tobacco Use  . Smoking status: Never Smoker  . Smokeless tobacco: Never Used  Substance Use Topics  . Alcohol use: Yes    Comment: rare-occ. per pt  . Drug use: No   Current Outpatient Medications  Medication Sig Dispense Refill  . acetaminophen (TYLENOL) 325 MG tablet Take 2 tablets (650 mg total) by mouth every 6 (six) hours as needed. Do not take more than 4000mg  of tylenol per day 30 tablet 0  . aspirin-acetaminophen-caffeine (EXCEDRIN MIGRAINE) 250-250-65 MG tablet Take 1 tablet by mouth every 6  (six) hours as needed for headache.    . busPIRone (BUSPAR) 15 MG tablet Take 1 tablet (15 mg total) by mouth at bedtime. 30 tablet 3  . divalproex (DEPAKOTE) 500 MG DR tablet Take 1 tablet (500 mg total) by mouth 2 (two) times daily. Must keep pending appt. with Clabe Seal, NP on 10/11/18, arrival time of 11am 60 tablet 1  . famotidine (PEPCID) 20 MG tablet Take 20 mg by mouth 2 (two) times daily.    Marland Kitchen gabapentin (NEURONTIN) 600 MG tablet Take 1 tablet (600 mg total) by mouth 3 (three) times daily. 270 tablet 3  . Multiple Vitamins-Minerals (MULTIVITAMIN WOMEN PO) Take 1 tablet by mouth daily.    . Norethindrone Acetate-Ethinyl Estradiol (MICROGESTIN) 1.5-30 MG-MCG tablet Take 1 tablet by mouth daily. 3 Package 3  . omeprazole (PRILOSEC) 40 MG capsule Take 1 capsule by mouth 30 minites before breakfast daily. 30 capsule 6  . pilocarpine (SALAGEN) 5 MG tablet Take 5 mg by mouth 4 (four) times daily as needed (for dry mouth).    . AMBULATORY NON FORMULARY MEDICATION GI Cocktail -  90 ml 2% Lidocaine                       90 ml Dicyclomine 10mg /73ml                      270 ml Maalox    Take 5-10 ml every 4 hrs as needed. (Patient not taking: Reported on 12/06/2018) 450 mL 0   No current facility-administered medications for this visit.    No Known Allergies   Review of Systems: All systems reviewed and negative except where noted in HPI.   No results found for: WBC, HGB, HCT, MCV, PLT    Physical Exam: BP 94/66   Pulse 68   Ht 5\' 5"  (1.651 m)   Wt 154 lb (69.9 kg)   LMP 11/15/2018 (Approximate)   BMI 25.63 kg/m  Constitutional: Pleasant,well-developed, female in no acute distress. HEENT: Normocephalic and atraumatic. Conjunctivae are normal. No scleral icterus. Neck supple.  Cardiovascular: Normal rate, regular rhythm.  Pulmonary/chest: Effort normal and breath sounds normal. No wheezing, rales or rhonchi. Abdominal: Soft, nondistended, nontender.  There are no masses palpable. No  hepatomegaly. Extremities: no edema Lymphadenopathy: No cervical adenopathy noted. Neurological: Alert and oriented to person place and time. Skin: Skin is warm and dry. No rashes noted. Psychiatric: Normal mood and affect. Behavior is normal.   ASSESSMENT AND PLAN: 23 year old female here for reassessment of the following issues:  GERD / dyspepsia / gas bloat syndrome - patient had a history of Nissen fundoplication 07/5092 for severe regurgitation and reflux. While that worked to control the particular problem, she's had severe dyspepsia and I suspect gas bloat syndrome following the procedure. She's had an extensive  evaluation performed, ultimately placed on buspirone which has provided benefit for her dyspepsia doing better in that regard. She still has symptoms from time to time in regard to that however main issue seems to be now pyrosis and symptoms more typical of her prior reflux. She is on Pepcid twice a day, will add omeprazole 40 mg once a day to see if that helps. I will also give her liquid Carafate 10 mL every 6 hours to see if that helps. She does take Zofran which helps her nausea and will continue to take that as needed. We'll see how she does on that regimen. She can contact me in a few weeks if no better.   Effie Cellar, MD [facility]

## 2018-12-06 NOTE — Patient Instructions (Addendum)
If you are age 25 or older, your body mass index should be between 23-30. Your Body mass index is 25.63 kg/m. If this is out of the aforementioned range listed, please consider follow up with your Primary Care Provider.  If you are age 26 or younger, your body mass index should be between 19-25. Your Body mass index is 25.63 kg/m. If this is out of the aformentioned range listed, please consider follow up with your Primary Care Provider.   We have printed prescriptions of the following medications for you to take to your pharmacy at your convenience:  Omeprazole 40mg : take once daily Zofran 4mg  ODT: Take every 6 hours hours as needed Carafate: Take 95ml every 8 hours as needed  Continue Buspar 15mg . Let us know when you need a refill.  Or let us know when you decide on a pharmacy and you can request a refill from them and they will send the request to [place].  Continue Pepcid.   Thank you for entrusting me with your care and for choosing [facility], Dr. Erma Cellar     .

## 2018-12-08 ENCOUNTER — Ambulatory Visit (INDEPENDENT_AMBULATORY_CARE_PROVIDER_SITE_OTHER): Payer: BLUE CROSS/BLUE SHIELD | Admitting: Family Medicine

## 2018-12-08 ENCOUNTER — Encounter: Payer: Self-pay | Admitting: Family Medicine

## 2018-12-08 VITALS — BP 112/80 | HR 69 | Temp 98.5°F | Ht 65.0 in | Wt 154.7 lb

## 2018-12-08 DIAGNOSIS — R21 Rash and other nonspecific skin eruption: Secondary | ICD-10-CM

## 2018-12-08 NOTE — Progress Notes (Signed)
HPI:  Using dictation device. Unfortunately this device frequently misinterprets words/phrases.  Joy Taylor is a pleasant 23 yo here for an acute visit for a skin rash: -started several weeks ago, recurrent and reports had in the past and resolved with selsun blue -she would like referral to dermatologist given the recurrent nature -mildly pruritic -on chest and neck and spreading to back  ROS: See pertinent positives and negatives per HPI.  Past Medical History:  Diagnosis Date  . Anxiety   . Depression   . Ehlers-Danlos disease   . Fibromyalgia   . Genital herpes    diagnosed by GYN-per patient no treatment advised unless daily flare ups occur  . GERD (gastroesophageal reflux disease)   . IBS (irritable bowel syndrome)   . Kidney stones   . Low BP    post-op  . Lupus ([place])   . Migraine   . Rheumatoid arthritis ([place])   . Stomach problems     Past Surgical History:  Procedure Laterality Date  . HIP ARTHROSCOPY Right 07/15/2016, 08/06/2015  . KNEE ARTHROSCOPY Right   . NISSEN FUNDOPLICATION  21/30/8657  . ROTATOR CUFF REPAIR Right 2010  . SHOULDER ARTHROSCOPY Right 06/29/2018    Family History  Problem Relation Age of Onset  . Diabetes Mother   . Diverticulitis Mother   . Colon polyps Mother   . Macular degeneration Maternal Grandmother   . Heart disease Maternal Grandmother   . Hypertension Maternal Grandfather   . Heart disease Maternal Grandfather   . Bone cancer Father   . Asthma Other   . Obesity Other   . Heart disease Other   . Colon cancer Neg Hx   . Esophageal cancer Neg Hx   . Pancreatic cancer Neg Hx   . Stomach cancer Neg Hx   . Liver disease Neg Hx   . Rectal cancer Neg Hx     SOCIAL HX: see hpi   Current Outpatient Medications:  .  acetaminophen (TYLENOL) 325 MG tablet, Take 2 tablets (650 mg total) by mouth every 6 (six) hours as needed. Do not take more than 4000mg  of tylenol per day, Disp: 30 tablet, Rfl: 0 .  AMBULATORY NON FORMULARY  MEDICATION, GI Cocktail -  90 ml 2% Lidocaine                       90 ml Dicyclomine 10mg /49ml                      270 ml Maalox    Take 5-10 ml every 4 hrs as needed., Disp: 450 mL, Rfl: 0 .  aspirin-acetaminophen-caffeine (EXCEDRIN MIGRAINE) 250-250-65 MG tablet, Take 1 tablet by mouth every 6 (six) hours as needed for headache., Disp: , Rfl:  .  busPIRone (BUSPAR) 15 MG tablet, Take 1 tablet (15 mg total) by mouth at bedtime., Disp: 30 tablet, Rfl: 3 .  divalproex (DEPAKOTE) 500 MG DR tablet, Take 1 tablet (500 mg total) by mouth 2 (two) times daily. Must keep pending appt. with Clabe Seal, NP on 10/11/18, arrival time of 11am, Disp: 60 tablet, Rfl: 1 .  famotidine (PEPCID) 20 MG tablet, Take 20 mg by mouth 2 (two) times daily., Disp: , Rfl:  .  gabapentin (NEURONTIN) 600 MG tablet, Take 1 tablet (600 mg total) by mouth 3 (three) times daily., Disp: 270 tablet, Rfl: 3 .  Multiple Vitamins-Minerals (MULTIVITAMIN WOMEN PO), Take 1 tablet by mouth daily., Disp: , Rfl:  .  Norethindrone Acetate-Ethinyl Estradiol (MICROGESTIN) 1.5-30 MG-MCG tablet, Take 1 tablet by mouth daily., Disp: 3 Package, Rfl: 3 .  omeprazole (PRILOSEC) 40 MG capsule, Take 1 capsule (40 mg total) by mouth daily., Disp: 30 capsule, Rfl: 3 .  ondansetron (ZOFRAN ODT) 4 MG disintegrating tablet, Take 1 tablet (4 mg total) by mouth every 6 (six) hours as needed for nausea or vomiting., Disp: 30 tablet, Rfl: 3 .  pilocarpine (SALAGEN) 5 MG tablet, Take 5 mg by mouth 4 (four) times daily as needed (for dry mouth)., Disp: , Rfl:  .  sucralfate (CARAFATE) 1 GM/10ML suspension, Take 10 mLs (1 g total) by mouth every 8 (eight) hours as needed., Disp: 420 mL, Rfl: 1  EXAM:  Vitals:   12/08/18 1653  BP: 112/80  Pulse: 69  Temp: 98.5 F (36.9 C)    Body mass index is 25.74 kg/m.  GENERAL: vitals reviewed and listed above, alert, oriented, appears well hydrated and in no acute distress  SKIN: scattered macules and patches of  erythematous mildly scaly skin on the chest, neck and upper back  MS: moves all extremities without noticeable abnormality  PSYCH: pleasant and cooperative, no obvious depression or anxiety  ASSESSMENT AND PLAN:  Discussed the following assessment and plan:  Skin rash  -we discussed possible serious and likely etiologies, workup and treatment, treatment risks and return precautions -after this discussion, Joy Taylor opted for referral to dermatologist for evaluation, trial topical antifungal in the interim -follow up advised as needed for this -of course, we advised Joy Taylor  to return or notify a doctor immediately if symptoms worsen or persist or new concerns arise.    Patient Instructions  -We placed a referral for you as discussed to the dermatologist. It usually takes about 1-2 weeks to process and schedule this referral. If you have not heard from [place] regarding this appointment in 2 weeks please contact our office.  -in the interim can try the antifungal (clotrimazole) twice daily for 3-4  Weeks.  I hope you are feeling better soon! Seek care promptly if your symptoms worsen, new concerns arise or you are not improving with treatment.     Joy Kern, DO

## 2018-12-08 NOTE — Patient Instructions (Addendum)
-  We placed a referral for you as discussed to the dermatologist. It usually takes about 1-2 weeks to process and schedule this referral. If you have not heard from [place] regarding this appointment in 2 weeks please contact our office.  -in the interim can try the antifungal (clotrimazole) twice daily for 3-4  Weeks. This is available over the counter.  I hope you are feeling better soon! Seek care promptly if your symptoms worsen, new concerns arise or you are not improving with treatment.

## 2018-12-13 ENCOUNTER — Other Ambulatory Visit: Payer: Self-pay | Admitting: Family Medicine

## 2018-12-26 ENCOUNTER — Ambulatory Visit: Payer: BLUE CROSS/BLUE SHIELD | Admitting: Registered"

## 2019-01-04 ENCOUNTER — Ambulatory Visit: Payer: BLUE CROSS/BLUE SHIELD | Admitting: Registered"

## 2019-01-10 ENCOUNTER — Encounter: Payer: Self-pay | Admitting: Registered"

## 2019-01-10 ENCOUNTER — Encounter: Payer: BLUE CROSS/BLUE SHIELD | Attending: Family Medicine | Admitting: Registered"

## 2019-01-10 DIAGNOSIS — R635 Abnormal weight gain: Secondary | ICD-10-CM | POA: Diagnosis present

## 2019-01-10 DIAGNOSIS — Z713 Dietary counseling and surveillance: Secondary | ICD-10-CM | POA: Insufficient documentation

## 2019-01-10 NOTE — Progress Notes (Signed)
  Medical Nutrition Therapy:  Appt start time: 2:12 end time:  3:00.  Prefers to be called "Joy Taylor" Assessment:  Primary concerns today:  Pt expectations: to be healthier, guidance on what to eat, and how much   Pt reports GI history of IBS, dyspepsia, Nissen fundoplication.   Pt states she recently moved into new apartment a few days ago. Pt states she is having a rash on neck and chest, was told it is from sugar; will see dermatologist in April. Pt states she has been skipping meals lately because she has too much going on and forgets to eat. Pt states her brother is in the hospital which is  causing stress as well. Pt reports constipation lately. Pt reports next trauma therapist appt is at the end of March:  On campus [facility]: [facility] On campus counseling: [facility]   Pt states it has been beneficial for her to eat smaller meals to decrease abdominal discomfort. Pt states she is moving next month into a new apartment.   Pt states she has also been stressed due to school, family, and finances; plans to attend yoga soon. Pt states she plans to reschedule appt with therapist soon.   Pt reports triggers for stomach discomfort: meat, dairy, and eating too quickly   Preferred Learning Style:   No preference indicated   Learning Readiness:   Contemplating  Ready  Change in progress   MEDICATIONS: See list   DIETARY INTAKE:  Usual eating pattern includes 3 meals and 2-3 snacks per day.  Everyday foods include meat substitutes, beans, dairy-free yogurt, salad, fruit, and soup. Avoided foods include meat, bread, and limits dairy.    24-hr recall:  B ( AM): skips S (AM): none L ( PM): veggie chicken nuggets + fruit + nuts Snk ( PM): fruit or almond milk yogurt or wheat thins D ( PM): beans + rice + substitute meat or substitute meat + rice + fruit  Snk ( PM): sometimes fruit or almond milk yogurt or wheat thins Beverages: water sometimes with crystal light (60-70 oz)  Usual physical  activity: plans to begin jujitsu; gym cardio + light weights + strength training 60-90 min, 3 days/week  Estimated energy needs: 2000 calories 225 g carbohydrates 150 g protein 56 g fat  Progress Towards Goal(s):  In progress.   Nutritional Diagnosis:  NB-1.1 Food and nutrition-related knowledge deficit As related to lack of prior nutrition-related information.  As evidenced by verbalizes incomplete information.    Intervention:  Nutrition education and counseling. Pt was educated and counseled on the importance of eating and giving our bodies nourishment throughout the day, components of a balanced meal, and ways to increase protein/calcium intake without eating meat and dairy. Pt was also counseled on ways to improve constipation. Pt was in agreement with goals listed.  Goals: - Aim to have balanced meals including protein, carbohydrates, vegetables, fruit, and calcium.  - Aim to have 3 meals a day. - Continue to stay hydrated.   Teaching Method Utilized:  Visual Auditory Hands on  Handouts given during visit include:  none  Barriers to learning/adherence to lifestyle change: school-work-life balance  Demonstrated degree of understanding via:  Teach Back   Monitoring/Evaluation:  Dietary intake, exercise, and body weight in 1 month(s).

## 2019-01-10 NOTE — Patient Instructions (Signed)
-   Aim to have balanced meals including protein, carbohydrates, vegetables, fruit, and calcium.   - Aim to have 3 meals a day.  - Continue to stay hydrated.

## 2019-01-13 ENCOUNTER — Other Ambulatory Visit: Payer: Self-pay | Admitting: Neurology

## 2019-01-25 ENCOUNTER — Other Ambulatory Visit: Payer: Self-pay

## 2019-01-25 ENCOUNTER — Ambulatory Visit (INDEPENDENT_AMBULATORY_CARE_PROVIDER_SITE_OTHER): Payer: BLUE CROSS/BLUE SHIELD | Admitting: Family Medicine

## 2019-01-25 ENCOUNTER — Encounter: Payer: Self-pay | Admitting: Family Medicine

## 2019-01-25 VITALS — BP 118/82 | HR 76 | Temp 97.9°F | Resp 12 | Ht 65.0 in | Wt 159.0 lb

## 2019-01-25 DIAGNOSIS — E162 Hypoglycemia, unspecified: Secondary | ICD-10-CM | POA: Diagnosis not present

## 2019-01-25 DIAGNOSIS — B359 Dermatophytosis, unspecified: Secondary | ICD-10-CM | POA: Diagnosis not present

## 2019-01-25 DIAGNOSIS — A5609 Other chlamydial infection of lower genitourinary tract: Secondary | ICD-10-CM

## 2019-01-25 DIAGNOSIS — A5602 Chlamydial vulvovaginitis: Secondary | ICD-10-CM

## 2019-01-25 DIAGNOSIS — R894 Abnormal immunological findings in specimens from other organs, systems and tissues: Secondary | ICD-10-CM | POA: Diagnosis not present

## 2019-01-25 MED ORDER — FREESTYLE SYSTEM KIT: 1.0000 | PACK | 0 refills | Status: AC | PRN

## 2019-01-25 MED ORDER — AZITHROMYCIN 500 MG PO TABS: 1000.0000 mg | ORAL_TABLET | Freq: Once | ORAL | 0 refills | Status: AC

## 2019-01-25 MED ORDER — KETOCONAZOLE 2 % EX CREA: 1.0000 "application " | TOPICAL_CREAM | Freq: Every day | CUTANEOUS | 0 refills | Status: AC

## 2019-01-25 NOTE — Patient Instructions (Addendum)
[name] I have seen you today for an acute visit.  A few things to remember from today's visit:   Low blood sugar - Plan: glucose monitoring kit (FREESTYLE) monitoring kit  Chlamydia vaginitis/cervicitis - Plan: azithromycin (ZITHROMAX) 500 MG tablet  Positive test for herpes simplex virus (HSV) antibody   For the skin lesions you can also use sensitive below from the neck down, rinse with water after 5 minutes. Keep appointment with dermatologist.  Genital Herpes Genital herpes is a common sexually transmitted infection (STI) that is caused by a virus. The virus spreads from person to person through sexual contact. Infection can cause itching, blisters, and sores around the genitals or rectum. Symptoms may last several days and then go away This is called an outbreak. However, the virus remains in your body, so you may have more outbreaks in the future. The time between outbreaks varies and can be months or years. Genital herpes affects men and women. It is particularly concerning for pregnant women because the virus can be passed to the baby during delivery and can cause serious problems. Genital herpes is also a concern for people who have a weak disease-fighting (immune) system. What are the causes? This condition is caused by the herpes simplex virus (HSV) type 1 or type 2. The virus may spread through:  Sexual contact with an infected person, including vaginal, anal, and oral sex.  Contact with fluid from a herpes sore.  The skin. This means that you can get herpes from an infected partner even if he or she does not have a visible sore or does not know that he or she is infected. What increases the risk? You are more likely to develop this condition if:  You have sex with many partners.  You do not use latex condoms during sex. What are the signs or symptoms? Most people do not have symptoms (asymptomatic) or have mild symptoms that may be mistaken for other skin  problems. Symptoms may include:  Small red bumps near the genitals, rectum, or mouth. These bumps turn into blisters and then turn into sores.  Flu-like symptoms, including: ? Fever. ? Body aches. ? Swollen lymph nodes. ? Headache.  Painful urination.  Pain and itching in the genital area or rectal area.  Vaginal discharge.  Tingling or shooting pain in the legs and buttocks. Generally, symptoms are more severe and last longer during the first (primary) outbreak. Flu-like symptoms are also more common during the primary outbreak. How is this diagnosed? Genital herpes may be diagnosed based on:  A physical exam.  Your medical history.  Blood tests.  A test of a fluid sample (culture) from an open sore. How is this treated? There is no cure for this condition, but treatment with antiviral medicines that are taken by mouth (orally) can do the following:  Speed up healing and relieve symptoms.  Help to reduce the spread of the virus to sexual partners.  Limit the chance of future outbreaks, or make future outbreaks shorter.  Lessen symptoms of future outbreaks. Your health care provider may also recommend pain relief medicines, such as aspirin or ibuprofen. Follow these instructions at home: Sexual activity  Do not have sexual contact during active outbreaks.  Practice safe sex. Latex condoms and female condoms may help prevent the spread of the herpes virus. General instructions  Keep the affected areas dry and clean.  Take over-the-counter and prescription medicines only as told by your health care provider.  Avoid rubbing or  touching blisters and sores. If you do touch blisters or sores: ? Wash your hands thoroughly with soap and water. ? Do not touch your eyes afterward.  To help relieve pain or itching, you may take the following actions as directed by your health care provider: ? Apply a cold, wet cloth (cold compress) to affected areas 4-6 times a day. ?  Apply a substance that protects your skin and reduces bleeding (astringent). ? Apply a gel that helps relieve pain around sores (lidocaine gel). ? Take a warm, shallow bath that cleans the genital area (sitz bath).  Keep all follow-up visits as told by your health care provider. This is important. How is this prevented?  Use condoms. Although anyone can get genital herpes during sexual contact, even with the use of a condom, a condom can provide some protection.  Avoid having multiple sexual partners.  Talk with your sexual partner about any symptoms either of you may have. Also, talk with your partner about any history of STIs.  Get tested for STIs before you have sex. Ask your partner to do the same.  Do not have sexual contact if you have symptoms of genital herpes. Contact a health care provider if:  Your symptoms are not improving with medicine.  Your symptoms return.  You have new symptoms.  You have a fever.  You have abdominal pain.  You have redness, swelling, or pain in your eye.  You notice new sores on other parts of your body.  You are a woman and experience bleeding between menstrual periods.  You have had herpes and you become pregnant or plan to become pregnant. Summary  Genital herpes is a common sexually transmitted infection (STI) that is caused by the herpes simplex virus (HSV) type 1 or type 2.  These viruses are most often spread through sexual contact with an infected person.  You are more likely to develop this condition if you have sex with many partners or you have unprotected sex.  Most people do not have symptoms (asymptomatic) or have mild symptoms that may be mistaken for other skin problems. Symptoms occur as outbreaks that may happen months or years apart.  There is no cure for this condition, but treatment with oral antiviral medicines can reduce symptoms, reduce the chance of spreading the virus to a partner, prevent future outbreaks, or  shorten future outbreaks. This information is not intended to replace advice given to you by your health care provider. Make sure you discuss any questions you have with your health care provider. Document Released: 10/30/2000 Document Revised: 10/02/2016 Document Reviewed: 10/02/2016 Elsevier Interactive Patient Education  2019 [employer]   In general please monitor for signs of worsening symptoms and seek immediate medical attention if any concerning.  Follow-up with your PCP if you have further questions or concerns.  I hope you get better soon!  

## 2019-01-25 NOTE — Progress Notes (Signed)
ACUTE VISIT   HPI:  Chief Complaint  Patient presents with  . Discuss STI    need rx for STI dx  . Rash    recurrent rash on face and neck    Batya Pitt is a 23 y.o. female with Hx of RA,fibromyalgia,IBS, anxiety, and depression is here today requesting treatment for chlamydia infection. According to patient, she went to [facility] and requested STD screening because she was having some discomfort upon urination. Negative for dysuria, gross hematuria, frequency, vaginal discharge, or vaginal bleeding. LMP 2 weeks ago.  HSV, syphilis, HIV, GC, and trichomoniasis were also done.  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  She has 2 sex partners.  She is concerned about HSV 2+ test, she states that she has had a positive in the past. She denies having any genital or oral lesion.  She wonders if she should take Valtrex.  In the past she has been treated for chlamydia and gonorrhea.  She is also complaining about mildly pruritic and now tender rash "all over." She has had rash for about 3 months. According to patient, it was recommended to try OTC cream (?) but it did not help. She stated that she had similar rash about 5 years ago. She denies sick contact, outdoor exposure, insect bites, or new medication/skin product. She has not noted vesicular lesions. Problem seems to be stable.  She is also requesting a glucometer, concerned about blood sugars.  She states that she received a glucometer from PCP but she lost it.  She denies history of diabetes. She states that after [facility] fundoplication surgery she has had episodes of feeling dizzy, nauseated, and diaphoretic. Symptoms improve right after she eats or drinks something. She is not reporting BS's readings.  According to patient, she has been interested in seeing an endocrinologist but because she does not have a specific diagnosis, she has not been able to do so.   Review of Systems  Constitutional: Positive  for diaphoresis. Negative for chills, fatigue and fever.  HENT: Negative for mouth sores and sore throat.   Eyes: Negative for discharge and redness.  Respiratory: Negative for cough, shortness of breath and wheezing.   Cardiovascular: Negative for chest pain, palpitations and leg swelling.  Gastrointestinal: Positive for nausea. Negative for abdominal pain and vomiting.  Genitourinary: Negative for dysuria, frequency, hematuria, urgency, vaginal bleeding and vaginal discharge.  Musculoskeletal: Positive for arthralgias. Negative for joint swelling and myalgias.  Skin: Positive for rash. Negative for wound.  Neurological: Positive for dizziness. Negative for syncope, weakness and headaches.  Psychiatric/Behavioral: Negative for confusion. The patient is nervous/anxious.     Current Outpatient Medications on File Prior to Visit  Medication Sig Dispense Refill  . acetaminophen (TYLENOL) 325 MG tablet Take 2 tablets (650 mg total) by mouth every 6 (six) hours as needed. Do not take more than 4073m of tylenol per day 30 tablet 0  . AMBULATORY NON FORMULARY MEDICATION GI Cocktail -  90 ml 2% Lidocaine                       90 ml Dicyclomine 151m5ml                      270 ml Maalox    Take 5-10 ml every 4 hrs as needed. 450 mL 0  . aspirin-acetaminophen-caffeine (EXCEDRIN MIGRAINE) 250-250-65 MG tablet Take 1 tablet by mouth every 6 (six) hours as  needed for headache.    . busPIRone (BUSPAR) 15 MG tablet Take 1 tablet (15 mg total) by mouth at bedtime. 30 tablet 3  . Certolizumab Pegol (CIMZIA PREFILLED) 2 X 200 MG/ML KIT Inject into the skin.    Marland Kitchen divalproex (DEPAKOTE) 500 MG DR tablet Take 1 tablet (500 mg total) by mouth 2 (two) times daily. Must keep pending appt. with Clabe Seal, NP on 10/11/18, arrival time of 11am 60 tablet 1  . famotidine (PEPCID) 20 MG tablet Take 20 mg by mouth 2 (two) times daily.    Marland Kitchen FML FORTE 0.25 % ophthalmic suspension     . gabapentin (NEURONTIN) 600 MG tablet  TAKE 1 TABLET BY MOUTH THREE TIMES DAILY 90 tablet 3  . JUNEL 1.5/30 1.5-30 MG-MCG tablet TAKE 1 TABLET BY MOUTH EVERY DAY 63 tablet 3  . Multiple Vitamins-Minerals (MULTIVITAMIN WOMEN PO) Take 1 tablet by mouth daily.    Marland Kitchen omeprazole (PRILOSEC) 40 MG capsule Take 1 capsule (40 mg total) by mouth daily. 30 capsule 3  . ondansetron (ZOFRAN ODT) 4 MG disintegrating tablet Take 1 tablet (4 mg total) by mouth every 6 (six) hours as needed for nausea or vomiting. 30 tablet 3  . pilocarpine (SALAGEN) 5 MG tablet Take 5 mg by mouth 4 (four) times daily as needed (for dry mouth).    Carren Rang BALANCE 0.6 % SOLN     . sucralfate (CARAFATE) 1 GM/10ML suspension Take 10 mLs (1 g total) by mouth every 8 (eight) hours as needed. (Patient not taking: Reported on 01/25/2019) 420 mL 1   No current facility-administered medications on file prior to visit.      Past Medical History:  Diagnosis Date  . Anxiety   . Depression   . Ehlers-Danlos disease   . Fibromyalgia   . Genital herpes    diagnosed by GYN-per patient no treatment advised unless daily flare ups occur  . GERD (gastroesophageal reflux disease)   . IBS (irritable bowel syndrome)   . Kidney stones   . Low BP    post-op  . Lupus ([place])   . Migraine   . Rheumatoid arthritis ([place])   . Stomach problems    No Known Allergies  Social History   Socioeconomic History  . Marital status: Single    Spouse name: Not on file  . Number of children: 0  . Years of education: 45  . Highest education level: Not on file  Occupational History  . Occupation: Optometrist  Social Needs  . Financial resource strain: Not on file  . Food insecurity:    Worry: Not on file    Inability: Not on file  . Transportation needs:    Medical: Not on file    Non-medical: Not on file  Tobacco Use  . Smoking status: Never Smoker  . Smokeless tobacco: Never Used  Substance and Sexual Activity  . Alcohol use: Yes    Comment: rare-occ. per pt  . Drug use: No   . Sexual activity: Yes  Lifestyle  . Physical activity:    Days per week: Not on file    Minutes per session: Not on file  . Stress: Not on file  Relationships  . Social connections:    Talks on phone: Not on file    Gets together: Not on file    Attends religious service: Not on file    Active member of club or organization: Not on file    Attends meetings of clubs or  organizations: Not on file    Relationship status: Not on file  Other Topics Concern  . 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 Reports this triggered all of her health problems.    Vitals:   01/25/19 1127  BP: 118/82  Pulse: 76  Resp: 12  Temp: 97.9 F (36.6 C)  SpO2: 100%   Body mass index is 26.46 kg/m.  Physical Exam  Nursing note and vitals reviewed. Constitutional: She is oriented to person, place, and time. She appears well-developed and well-nourished. No distress.  HENT:  Head: Normocephalic and atraumatic.  Mouth/Throat: Oropharynx is clear and moist and mucous membranes are normal.  Eyes: Conjunctivae are normal.  Cardiovascular: Normal rate and regular rhythm.  No murmur heard. Respiratory: Effort normal and breath sounds normal. No respiratory distress.  GI: Soft. She exhibits no mass. There is no abdominal tenderness.  Musculoskeletal:        General: No edema.  Lymphadenopathy:    She has no cervical adenopathy.  Neurological: She is alert and oriented to person, place, and time.  Skin: Skin is warm. Rash noted. Rash is macular. Rash is not vesicular. No erythema.  Macular lesions scattered on upper chest x 3 and a couple around neck. Lesions have defined borders,not erythematous,or tender upon palpation. Slightly hyperpigmented (tanned), 2-3 cm.  Psychiatric: She has a normal mood and  affect.  Well groomed, good eye contact.   ASSESSMENT AND PLAN:   Patrick was seen today for discuss sti and rash.  Diagnoses and all orders for this visit:  Chlamydia vaginitis/cervicitis She is very concerned about antibiotic because she has had poor tolerance in the past due to chronic GI problems. Recommend azithromycin 1000 mg x 1. Strongly recommend condoms use and safer sex practices.  -     azithromycin (ZITHROMAX) 500 MG tablet; Take 2 tablets (1,000 mg total) by mouth once for 1 dose.  Low blood sugar ?  Hypoglycemic events. Explained that glucometer may not be covered by her insurance, prescription given. Follow-up with PCP if needed.  -     glucose monitoring kit (FREESTYLE) monitoring kit; 1 each by Does not apply route as needed for other.  Tinea Characteristics of lesions suggest a fungal etiology. Recommend topical ketoconazole daily for 2 to 3 weeks. Sense of blue shampoo OTC to apply from neck down,leave it on skin for 5 min then rinse. She has an appointment with dermatologist in 02/2019, recommend keeping it.  -     ketoconazole (NIZORAL) 2 % cream; Apply 1 application topically daily for 21 days.  Positive test for herpes simplex virus (HSV) antibody We discussed lab results, HSV 2 symptoms,mode of transmission, and treatment. She states that she has never had a skin lesion, so I do not think treatment with Valtrex is needed at this time. Instructed to monitor for vesicular rash. Follow-up with PCP as needed.   Return if symptoms worsen or fail to improve.    Nahjae Hoeg G. Martinique, MD  [facility]. [place] office.

## 2019-01-27 ENCOUNTER — Telehealth: Payer: Self-pay | Admitting: *Deleted

## 2019-01-27 NOTE — Telephone Encounter (Signed)
Prior auth for the [facility] monitor sent to [email or web address] [id number].

## 2019-02-07 ENCOUNTER — Encounter: Payer: BLUE CROSS/BLUE SHIELD | Attending: Family Medicine | Admitting: Registered"

## 2019-02-07 ENCOUNTER — Other Ambulatory Visit: Payer: Self-pay

## 2019-02-07 DIAGNOSIS — Z713 Dietary counseling and surveillance: Secondary | ICD-10-CM | POA: Insufficient documentation

## 2019-02-07 DIAGNOSIS — R635 Abnormal weight gain: Secondary | ICD-10-CM | POA: Insufficient documentation

## 2019-02-08 ENCOUNTER — Ambulatory Visit (INDEPENDENT_AMBULATORY_CARE_PROVIDER_SITE_OTHER): Payer: BLUE CROSS/BLUE SHIELD | Admitting: Family Medicine

## 2019-02-08 ENCOUNTER — Other Ambulatory Visit: Payer: Self-pay | Admitting: Neurology

## 2019-02-08 ENCOUNTER — Encounter: Payer: Self-pay | Admitting: Family Medicine

## 2019-02-08 VITALS — BP 100/66 | HR 75 | Temp 98.3°F | Resp 12 | Wt 157.4 lb

## 2019-02-08 DIAGNOSIS — Z8619 Personal history of other infectious and parasitic diseases: Secondary | ICD-10-CM

## 2019-02-08 DIAGNOSIS — K529 Noninfective gastroenteritis and colitis, unspecified: Secondary | ICD-10-CM

## 2019-02-08 DIAGNOSIS — R110 Nausea: Secondary | ICD-10-CM

## 2019-02-08 DIAGNOSIS — N941 Unspecified dyspareunia: Secondary | ICD-10-CM | POA: Diagnosis not present

## 2019-02-08 MED ORDER — ONDANSETRON HCL 4 MG PO TABS: 4.0000 mg | ORAL_TABLET | Freq: Three times a day (TID) | ORAL | 0 refills | Status: AC | PRN

## 2019-02-08 NOTE — Progress Notes (Signed)
ACUTE VISIT   HPI:  Chief Complaint  Patient presents with  . Exposure to STD     Pain with sexual intercourse     [name] No is a 23 y.o. female, who is here today complaining of vaginal dryness and dyspareunia, she thinks this may be related to another chlamydia infection. On 01/25/2019, she was treated with azithromycin 1000 mg x 1 because positive chlamydia in urine. She was instructed to continue wearing condoms for STD prevention, which she says she has done. She denies vaginal discharge or vaginal bleeding. LMP 01/17/2018.  She has tried OTC vaginal lubricants but did not help. Hx of RA.  + Back pain, which she has had intermittently for years.  Exacerbated by movement and alleviated by rest. She takes OTC acetaminophen.  She is also concerned about body aches, fatigue, intermittent periumbilical abdominal pain, and diarrhea. She is not sure about exacerbating or alleviating factors.  She is reporting a friend having GI symptoms and recently diagnosed with influenza. She has had some chills, denies fever. Negative for respiratory symptoms. She denies recent travel or visitors with history of travel.  She has a history of IBS constipation. She is not sure if body aches are related to her fibromyalgia.   Review of Systems  Constitutional: Positive for activity change, appetite change, chills and fatigue. Negative for fever.  HENT: Negative for congestion, mouth sores, postnasal drip and sore throat.   Respiratory: Negative for cough, shortness of breath and wheezing.   Gastrointestinal: Positive for abdominal pain, diarrhea and nausea. Negative for blood in stool and vomiting.  Genitourinary: Positive for dyspareunia and vaginal bleeding. Negative for decreased urine volume, dysuria, genital sores, hematuria, pelvic pain and vaginal discharge.  Musculoskeletal: Positive for back pain and myalgias. Negative for joint swelling and neck stiffness.  Skin:  Negative for rash.  Neurological: Negative for weakness and headaches.  Psychiatric/Behavioral: Negative for confusion. The patient is nervous/anxious.     Current Outpatient Medications on File Prior to Visit  Medication Sig Dispense Refill  . acetaminophen (TYLENOL) 325 MG tablet Take 2 tablets (650 mg total) by mouth every 6 (six) hours as needed. Do not take more than 4024m of tylenol per day 30 tablet 0  . AMBULATORY NON FORMULARY MEDICATION GI Cocktail -  90 ml 2% Lidocaine                       90 ml Dicyclomine 124m5ml                      270 ml Maalox    Take 5-10 ml every 4 hrs as needed. 450 mL 0  . aspirin-acetaminophen-caffeine (EXCEDRIN MIGRAINE) 250-250-65 MG tablet Take 1 tablet by mouth every 6 (six) hours as needed for headache.    . busPIRone (BUSPAR) 15 MG tablet Take 1 tablet (15 mg total) by mouth at bedtime. 30 tablet 3  . famotidine (PEPCID) 20 MG tablet Take 20 mg by mouth 2 (two) times daily.    . Marland KitchenML FORTE 0.25 % ophthalmic suspension     . gabapentin (NEURONTIN) 600 MG tablet TAKE 1 TABLET BY MOUTH THREE TIMES DAILY 90 tablet 3  . glucose monitoring kit (FREESTYLE) monitoring kit 1 each by Does not apply route as needed for other. 1 each 0  . JUNEL 1.5/30 1.5-30 MG-MCG tablet TAKE 1 TABLET BY MOUTH EVERY DAY 63 tablet 3  . ketoconazole (NIZORAL) 2 % cream  Apply 1 application topically daily for 21 days. 30 g 0  . Multiple Vitamins-Minerals (MULTIVITAMIN WOMEN PO) Take 1 tablet by mouth daily.    Marland Kitchen omeprazole (PRILOSEC) 40 MG capsule Take 1 capsule (40 mg total) by mouth daily. 30 capsule 3  . ondansetron (ZOFRAN ODT) 4 MG disintegrating tablet Take 1 tablet (4 mg total) by mouth every 6 (six) hours as needed for nausea or vomiting. 30 tablet 3  . pilocarpine (SALAGEN) 5 MG tablet Take 5 mg by mouth 4 (four) times daily as needed (for dry mouth).    . sucralfate (CARAFATE) 1 GM/10ML suspension Take 10 mLs (1 g total) by mouth every 8 (eight) hours as needed. 420  mL 1  . SYSTANE BALANCE 0.6 % SOLN     . Certolizumab Pegol (CIMZIA PREFILLED) 2 X 200 MG/ML KIT Inject into the skin.     No current facility-administered medications on file prior to visit.      Past Medical History:  Diagnosis Date  . Anxiety   . Depression   . Ehlers-Danlos disease   . Fibromyalgia   . Genital herpes    diagnosed by GYN-per patient no treatment advised unless daily flare ups occur  . GERD (gastroesophageal reflux disease)   . IBS (irritable bowel syndrome)   . Kidney stones   . Low BP    post-op  . Lupus ([place])   . Migraine   . Rheumatoid arthritis ([place])   . Stomach problems    No Known Allergies  Social History   Socioeconomic History  . Marital status: Single    Spouse name: Not on file  . Number of children: 0  . Years of education: 50  . Highest education level: Not on file  Occupational History  . Occupation: Optometrist  Social Needs  . Financial resource strain: Not on file  . Food insecurity:    Worry: Not on file    Inability: Not on file  . Transportation needs:    Medical: Not on file    Non-medical: Not on file  Tobacco Use  . Smoking status: Never Smoker  . Smokeless tobacco: Never Used  Substance and Sexual Activity  . Alcohol use: Yes    Comment: rare-occ. per pt  . Drug use: No  . Sexual activity: Yes  Lifestyle  . Physical activity:    Days per week: Not on file    Minutes per session: Not on file  . Stress: Not on file  Relationships  . Social connections:    Talks on phone: Not on file    Gets together: Not on file    Attends religious service: Not on file    Active member of club or organization: Not on file    Attends meetings of clubs or organizations: Not on file    Relationship status: Not on file  Other Topics Concern  . 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 Reports this triggered all of her health problems.    Vitals:   02/08/19 1211  BP: 100/66  Pulse: 75  Resp: 12  Temp: 98.3 F (36.8 C)  SpO2: 99%   Body mass index is 26.19 kg/m.  Physical Exam  Nursing note  and vitals reviewed. Constitutional: She is oriented to person, place, and time. She appears well-developed and well-nourished. She does not appear ill. No distress.  HENT:  Head: Normocephalic and atraumatic.  Mouth/Throat: Oropharynx is clear and moist. Mucous membranes are dry (mild).  Eyes: Conjunctivae are normal. No scleral icterus.  Cardiovascular: Normal rate and regular rhythm.  No murmur heard. Respiratory: Effort normal and breath sounds normal. No respiratory distress.  GI: Soft. Bowel sounds are normal. She exhibits no distension and no mass. There is no hepatomegaly. There is no abdominal tenderness. There is no CVA tenderness.  Genitourinary: There is no rash, tenderness or lesion on the right labia. There is no rash, tenderness or lesion on the left labia. Cervix exhibits discharge (whitish). Cervix exhibits no motion tenderness and no friability.    No vaginal discharge or erythema.  No erythema in the vagina.    No foreign body in the vagina.     Genitourinary Comments: Sample for STD was collected. Refused manual pelvic exam due to discomfort.   Musculoskeletal:        General: No edema.     Lumbar back: She exhibits tenderness. She exhibits normal range of motion and no bony tenderness.  Lymphadenopathy:    She has no cervical adenopathy.       Right: No inguinal and no supraclavicular adenopathy present.       Left: No inguinal and no supraclavicular adenopathy present.  Neurological: She is alert and oriented to person, place, and time. She has normal strength. Gait normal.  Skin: Skin is warm. No rash noted. No erythema.  Psychiatric: She has a normal mood and affect.  Well groomed, good eye  contact.   ASSESSMENT AND PLAN:  Ms. Anjoli was seen today for exposure to std.  Diagnoses and all orders for this visit:  Gastroenteritis, acute Most likely viral etiology, so symptomatic treatment recommended for now.Other possible causes discussed but at this time I do not think further work-up is necessary. Recommend self quarantine at home for 14 days.  Oral hydration, Gatorade or Pedialyte are good options. Bland and light diet if tolerated. Clearly instructed about warning signs. F/U as needed.  Dyspareunia in female Possible etiologies discussed. Also reporting dry eyes and dry oral mucosa.  Recommend mentioning these symptoms to rheumatologist, she may need to be evaluated for Sjogren's.  -     Chlamydia/Gonococcus/Trichomonas, NAA  Hx of chlamydia infection Education about STD prevention provided. Further recommendation will be given according to lab results.  -     Chlamydia/Gonococcus/Trichomonas, NAA  Nausea without vomiting Small and frequent sips of clear fluids. Symptomatic treatment with Zofran recommended. Instructed about warning signs.  -     ondansetron (ZOFRAN) 4 MG tablet; Take 1 tablet (4 mg total) by mouth every 8 (eight) hours as needed for up to 3 days for nausea or vomiting.    Return if symptoms worsen or fail to improve.    Betty G. Martinique, MD  [facility]. [place] office.

## 2019-02-08 NOTE — Patient Instructions (Addendum)
A few things to remember from today's visit:   Gastroenteritis, acute  Dyspareunia in female - Plan: Chlamydia/Gonococcus/Trichomonas, NAA  Hx of chlamydia infection - Plan: Chlamydia/Gonococcus/Trichomonas, NAA  Nausea without vomiting - Plan: ondansetron (ZOFRAN) 4 MG tablet  Please mention vaginal dryness to your rheumatologist if problem is persistent. Continue wearing condoms all the time.   Stomach symptoms are most likely viral and self-limited. Good hand hygiene is important, decrease risk of transmission. Small and frequent sips of clear fluids, Pedialyte or Gatorade age good options.  BRAT diet: bananas, rice, applesauce, and toast.  If vomiting stop solids for 24 hours then resume liquid/soft diet and advance as tolerated.   Notified immediately or seek medical attention if you cannot keep fluids down, decreased urine output, severe abdominal pain, red bright blood per rectum, or changes in mental status.  Follow if not full recovery in 2-3 weeks.       Please be sure medication list is accurate. If a new problem present, please set up appointment sooner than planned today.

## 2019-02-11 LAB — CHLAMYDIA/GONOCOCCUS/TRICHOMONAS, NAA
Chlamydia by NAA: NEGATIVE
Gonococcus by NAA: NEGATIVE
Trich vag by NAA: NEGATIVE

## 2019-02-13 ENCOUNTER — Encounter: Payer: Self-pay | Admitting: Family Medicine

## 2019-03-01 ENCOUNTER — Telehealth: Payer: Self-pay | Admitting: Neurology

## 2019-03-01 NOTE — Telephone Encounter (Signed)
This is a Willis/Megan NP patient who was last seen by [place] in November 2019. She is on the schedule to see Dr. Jannifer Franklin in June. She could be called and offered a sooner appointment via virtual visit with Judson Roch NP.

## 2019-03-07 NOTE — Telephone Encounter (Signed)
I called and left patient a message for Virtual Visit . Patient called me back.

## 2019-03-07 NOTE — Telephone Encounter (Signed)
Patient is scheduled with Judson Roch . 03/09/2019 Patient is aware. Thanks Hinton Dyer.

## 2019-03-09 ENCOUNTER — Other Ambulatory Visit: Payer: Self-pay

## 2019-03-09 ENCOUNTER — Ambulatory Visit (INDEPENDENT_AMBULATORY_CARE_PROVIDER_SITE_OTHER): Payer: BLUE CROSS/BLUE SHIELD | Admitting: Neurology

## 2019-03-09 ENCOUNTER — Encounter: Payer: Self-pay | Admitting: Neurology

## 2019-03-09 DIAGNOSIS — G8929 Other chronic pain: Secondary | ICD-10-CM

## 2019-03-09 DIAGNOSIS — R51 Headache: Secondary | ICD-10-CM

## 2019-03-09 DIAGNOSIS — R519 Headache, unspecified: Secondary | ICD-10-CM

## 2019-03-09 MED ORDER — ERENUMAB-AOOE 70 MG/ML ~~LOC~~ SOAJ: 70.0000 mg | SUBCUTANEOUS | 3 refills | Status: DC

## 2019-03-09 NOTE — Progress Notes (Signed)
I have read the note, and I agree with the clinical assessment and plan.  Elwanda Moger K Catelin Manthe   

## 2019-03-09 NOTE — Progress Notes (Signed)
Virtual Visit via Video Note  I connected with Joy Taylor on 03/09/19 at  1:45 PM EDT by a video enabled telemedicine application and verified that I am speaking with the correct person using two identifiers.   I discussed the limitations of evaluation and management by telemedicine and the availability of in person appointments. The patient expressed understanding and agreed to proceed.  History of Present Illness: 03/09/2019 SS: Joy Taylor is a 23 year old female with history of fibromyalgia migraine headaches.  At her last visit she was to schedule MRI of the brain, complaining of a new type of headache to the left side of her face.  She is currently taking Depakote 500 mg twice daily, gabapentin 600 mg 3 times a day. In the past she has been on Cymbalta, but had cognitive side effects.  She reports she is been having more frequent headaches, she was recently started on Lexapro by her psychiatrist.  She reports that she is currently having about 3-4 headaches per week, as result of Lexapro.  She is currently in [place] visiting her parents, she reports she will be there for several weeks.  She was to have MRI of the brain however this was never set up.  10/11/2018 MM: Joy Taylor is a 23 year old female with a history of fibromyalgia and migraine headaches.  She returns today for follow-up.  She states that she is no longer on methotrexate.  Therefore her headaches have improved.  She states in the last couple weeks she has been having "electrical headaches."  She states that these headaches occur on the left side of the face and usually is a sharp stabbing pain that only last for several seconds.  Reports that it may come and go but then it will dissipate.  These are not happening daily but does happen on a weekly basis.  She continues on gabapentin and Depakote.  Reports that she is consistent with her medications.  She states in regards to fibromyalgia she is having increased pain all over.  She  continues to have trouble with her memory.  She states that she has a hard time concentrating while driving.  Reports a lot of fatigue.  Reports that she has numbness and tingling in both arms and legs intermittently.  She reports that her mood has been fluctuating.  Denies any thoughts of suicide or thoughts of harming others.  She returns today for follow-up.   Observations/Objective: Alert, answers questions appropriately, follows commands, speech is clear and concise, facial symmetry noted  Assessment and Plan: 1.  Migraine headache  She is currently taking Depakote, 500 mg twice daily.  We discussed that this is category X for pregnancy.  She has had an increase in her headaches, with the addition of Lexapro prescribed by her psychiatrist for depression.  Today we will try to start Aimovig 70 mg monthly injection.  We discussed side effects, she is not allergic to latex. Hopefully we can improve her headaches, get her off the Depakote.  She will continue taking gabapentin.  I will check lab work today, however she is out of town will have to get in a few weeks.  At her last visit an MRI was ordered, was never done.  I will reorder the MRI today.  Hopefully Aimovig will improve her headaches. In about 3 months we can see her back in the office, we can decrease the Depakote.  When we decrease Depakote we can have her take 500 mg once daily for  3 weeks then stop the medication.  Again we discussed the importance of not getting pregnant will take Depakote.  She reports that she is currently taking birth control. I did consider starting nortriptyline, however decided against it since, Lexapro was just started.   In the past for her headaches she is tried Topamax, Cymbalta, gabapentin, Depakote.   Follow Up Instructions: 3 months for revisit   I discussed the assessment and treatment plan with the patient. The patient was provided an opportunity to ask questions and all were answered. The patient  agreed with the plan and demonstrated an understanding of the instructions.   The patient was advised to call back or seek an in-person evaluation if the symptoms worsen or if the condition fails to improve as anticipated.  I provided 30 minutes of non-face-to-face time during this encounter.  Evangeline Dakin, DNP  [facility] [address] [place], [place] [address] [phone]

## 2019-03-16 ENCOUNTER — Telehealth: Payer: Self-pay | Admitting: Neurology

## 2019-03-16 NOTE — Telephone Encounter (Signed)
[employer] [place] auth: NPR spoke to [place] Ref # [id number] order sent to GI. They will reach out to the pt to schedule.

## 2019-03-30 ENCOUNTER — Telehealth: Payer: Self-pay | Admitting: *Deleted

## 2019-03-30 ENCOUNTER — Telehealth: Payer: Self-pay | Admitting: Neurology

## 2019-03-30 ENCOUNTER — Other Ambulatory Visit: Payer: Self-pay | Admitting: *Deleted

## 2019-03-30 NOTE — Telephone Encounter (Signed)
LMVM for pt to return call to schedule follow up appointment. When returns call please make appointment.   3 mo.

## 2019-03-30 NOTE — Telephone Encounter (Signed)
Patient called in I made her follow up with Joy Taylor for 06/29/19.

## 2019-03-30 NOTE — Telephone Encounter (Signed)
Patient called in needing a refill on her Depakote

## 2019-04-03 ENCOUNTER — Other Ambulatory Visit: Payer: Self-pay | Admitting: *Deleted

## 2019-04-03 MED ORDER — DIVALPROEX SODIUM 500 MG PO DR TAB: 500.0000 mg | DELAYED_RELEASE_TABLET | Freq: Two times a day (BID) | ORAL | 0 refills | Status: DC

## 2019-04-03 NOTE — Telephone Encounter (Signed)
Done

## 2019-04-12 ENCOUNTER — Other Ambulatory Visit: Payer: Self-pay

## 2019-04-12 MED ORDER — BUSPIRONE HCL 15 MG PO TABS: 15.0000 mg | ORAL_TABLET | Freq: Every day | ORAL | 5 refills | Status: DC

## 2019-04-24 ENCOUNTER — Ambulatory Visit (INDEPENDENT_AMBULATORY_CARE_PROVIDER_SITE_OTHER): Payer: BC Managed Care – PPO | Admitting: Family Medicine

## 2019-04-24 ENCOUNTER — Other Ambulatory Visit: Payer: Self-pay

## 2019-04-24 ENCOUNTER — Encounter: Payer: Self-pay | Admitting: Family Medicine

## 2019-04-24 VITALS — BP 122/74 | HR 100 | Temp 97.9°F | Resp 12 | Ht 65.0 in | Wt 162.2 lb

## 2019-04-24 DIAGNOSIS — N939 Abnormal uterine and vaginal bleeding, unspecified: Secondary | ICD-10-CM | POA: Diagnosis not present

## 2019-04-24 DIAGNOSIS — R109 Unspecified abdominal pain: Secondary | ICD-10-CM | POA: Diagnosis not present

## 2019-04-24 DIAGNOSIS — K5900 Constipation, unspecified: Secondary | ICD-10-CM | POA: Diagnosis not present

## 2019-04-24 DIAGNOSIS — Z113 Encounter for screening for infections with a predominantly sexual mode of transmission: Secondary | ICD-10-CM

## 2019-04-24 LAB — POCT URINE PREGNANCY: Preg Test, Ur: NEGATIVE

## 2019-04-24 NOTE — Patient Instructions (Addendum)
A few things to remember from today's visit:   Vaginal bleeding, abnormal - Plan: CBC, TSH, POCT urine pregnancy, US PELVIC COMPLETE WITH TRANSVAGINAL  Abdominal pain, unspecified abdominal location - Plan: CBC, POCT urine pregnancy, US PELVIC COMPLETE WITH TRANSVAGINAL  Screen for STD (sexually transmitted disease) - Plan: Chlamydia/Gonococcus/Trichomonas, NAA, HIV Antibody (routine testing w rflx)  Constipation, unspecified constipation type - Plan: Basic metabolic panel, TSH  For constipation you can try MiraLAX daily as needed. Benefiber is a type of fiber over-the-counter, you can take 1 teaspoon in the morning and at night, mixed with your water or coffee.    Please be sure medication list is accurate. If a new problem present, please set up appointment sooner than planned today.

## 2019-04-24 NOTE — Progress Notes (Addendum)
ACUTE VISIT   HPI:  Chief Complaint  Patient presents with  . Discomfort and spotting during intercourse    happened 2 times in the last month, discomfort/abdominal pain and spotting after intercourse    Dorisann Diehl is a 23 y.o. female, who is here today complaining of 2 episodes of vaginal bleeding after sex intercourse, bleeding lasted 2 to 3 hours each. Reporting problem as new.  Mild dyspareunia. No Hx of trauma.  Associated intermittent lower abdominal pain, no radiated. Pain is mild,it does not interfere with her daily activities,she notices more at night when she is in bed. It does not interfere with sleep either. Pain slightly better after defecation.  She has not identified exacerbating or alleviating factors. Denies fever,chills,or fatigue.  She has had past history of STDs, last treated for chlamydia in 01/2019. LMP 04/10/19. Reporting "normal" pap smears a year ago. She has not noted abnormal vaginal discharge or urinary symptoms.  She has history of RA and fibromyalgia, she is not having more arthralgias or myalgias than usual.  "Little" constipation,hard and bulky stools.  Reporting Hx of hemorrhoids. + Dyschezia,no hematochezia. She has not tried OTC medications.  No associated nausea,vomiting,or blood in stool.  Review of Systems  Constitutional: Negative for appetite change and fatigue.  HENT: Negative for mouth sores and sore throat.   Respiratory: Negative for cough, shortness of breath and wheezing.   Cardiovascular: Negative for leg swelling.  Endocrine: Negative for cold intolerance and heat intolerance.  Genitourinary: Positive for pelvic pain. Negative for decreased urine volume, dysuria, frequency, genital sores and hematuria.  Musculoskeletal: Negative for joint swelling.  Skin: Negative for rash.  Rest see pertinent positives and negatives per HPI.   Current Outpatient Medications on File Prior to Visit  Medication Sig Dispense  Refill  . acetaminophen (TYLENOL) 325 MG tablet Take 2 tablets (650 mg total) by mouth every 6 (six) hours as needed. Do not take more than 4060m of tylenol per day 30 tablet 0  . AMBULATORY NON FORMULARY MEDICATION GI Cocktail -  90 ml 2% Lidocaine                       90 ml Dicyclomine 170m5ml                      270 ml Maalox    Take 5-10 ml every 4 hrs as needed. 450 mL 0  . aspirin-acetaminophen-caffeine (EXCEDRIN MIGRAINE) 250-250-65 MG tablet Take 1 tablet by mouth every 6 (six) hours as needed for headache.    . busPIRone (BUSPAR) 15 MG tablet Take 1 tablet (15 mg total) by mouth at bedtime. 30 tablet 5  . Certolizumab Pegol (CIMZIA PREFILLED) 2 X 200 MG/ML KIT Inject into the skin.    . Marland Kitchenivalproex (DEPAKOTE) 500 MG DR tablet Take 1 tablet (500 mg total) by mouth 2 (two) times daily. 180 tablet 0  . Erenumab-aooe (AIMOVIG) 70 MG/ML SOAJ Inject 70 mg into the skin every 30 (thirty) days. 1 pen 3  . escitalopram (LEXAPRO) 10 MG tablet Take 10 mg by mouth daily.    . famotidine (PEPCID) 20 MG tablet Take 20 mg by mouth 2 (two) times daily.    . fluticasone (FLONASE) 50 MCG/ACT nasal spray     . FML FORTE 0.25 % ophthalmic suspension     . gabapentin (NEURONTIN) 600 MG tablet TAKE 1 TABLET BY MOUTH THREE TIMES DAILY 90 tablet 3  .  glucose monitoring kit (FREESTYLE) monitoring kit 1 each by Does not apply route as needed for other. 1 each 0  . JUNEL 1.5/30 1.5-30 MG-MCG tablet TAKE 1 TABLET BY MOUTH EVERY DAY 63 tablet 3  . Multiple Vitamins-Minerals (MULTIVITAMIN WOMEN PO) Take 1 tablet by mouth daily.    Marland Kitchen omeprazole (PRILOSEC) 40 MG capsule Take 1 capsule (40 mg total) by mouth daily. 30 capsule 3  . ondansetron (ZOFRAN ODT) 4 MG disintegrating tablet Take 1 tablet (4 mg total) by mouth every 6 (six) hours as needed for nausea or vomiting. 30 tablet 3  . pilocarpine (SALAGEN) 5 MG tablet Take 5 mg by mouth 4 (four) times daily as needed (for dry mouth).    . sucralfate (CARAFATE) 1  GM/10ML suspension Take 10 mLs (1 g total) by mouth every 8 (eight) hours as needed. 420 mL 1  . SYSTANE BALANCE 0.6 % SOLN      No current facility-administered medications on file prior to visit.      Past Medical History:  Diagnosis Date  . Anxiety   . Depression   . Ehlers-Danlos disease   . Fibromyalgia   . Genital herpes    diagnosed by GYN-per patient no treatment advised unless daily flare ups occur  . GERD (gastroesophageal reflux disease)   . IBS (irritable bowel syndrome)   . Kidney stones   . Low BP    post-op  . Lupus ([place])   . Migraine   . Rheumatoid arthritis ([place])   . Stomach problems    No Known Allergies  Social History   Socioeconomic History  . Marital status: Single    Spouse name: Not on file  . Number of children: 0  . Years of education: 23  . Highest education level: Not on file  Occupational History  . Occupation: Optometrist  Social Needs  . Financial resource strain: Not on file  . Food insecurity:    Worry: Not on file    Inability: Not on file  . Transportation needs:    Medical: Not on file    Non-medical: Not on file  Tobacco Use  . Smoking status: Never Smoker  . Smokeless tobacco: Never Used  Substance and Sexual Activity  . Alcohol use: Yes    Comment: rare-occ. per pt  . Drug use: No  . Sexual activity: Yes  Lifestyle  . Physical activity:    Days per week: Not on file    Minutes per session: Not on file  . Stress: Not on file  Relationships  . Social connections:    Talks on phone: Not on file    Gets together: Not on file    Attends religious service: Not on file    Active member of club or organization: Not on file    Attends meetings of clubs or organizations: Not on file    Relationship status: Not on file  Other Topics Concern  . 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 Reports this triggered all of her health problems.    Vitals:   04/24/19 1457  BP: 122/74  Pulse: 100  Resp: 12  Temp: 97.9 F (36.6 C)  SpO2: 98%   Body mass index  is 27 kg/m.  Physical Exam  Nursing note and vitals reviewed. Constitutional: She is oriented to person, place, and time. She appears well-developed. No distress.  HENT:  Head: Normocephalic and atraumatic.  Mouth/Throat: Oropharynx is clear and moist and mucous membranes are normal.  Eyes: Conjunctivae are normal.  Cardiovascular: Normal rate and regular rhythm.  No murmur heard. Respiratory: Effort normal and breath sounds normal. No respiratory distress.  GI: Soft. She exhibits no mass. There is no hepatomegaly. Tenderness: periumbilical > supropubic.  Genitourinary: There is no rash, tenderness or lesion on the right labia. There is no rash, tenderness or lesion on the left labia. Uterus is not enlarged and not tender. Cervix exhibits discharge. Cervix exhibits no motion tenderness and no friability. Right adnexum displays no mass and no fullness. Left adnexum displays no mass, no tenderness and no fullness.    Vaginal discharge present.     No vaginal erythema or tenderness.  No erythema or tenderness in the vagina.    Genitourinary Comments: Friable cervix. Transformation zone present. Whitish vaginal discharge, moderate amount.    Musculoskeletal:        General: No edema.  Lymphadenopathy:       Right: No inguinal adenopathy present.       Left: No inguinal adenopathy present.  Neurological: She is alert and oriented to person, place, and time. She has normal strength. No cranial nerve deficit.  Skin: Skin is warm. No rash noted. No erythema.  Psychiatric: She has a normal mood and affect.  Well groomed, good eye contact.    ASSESSMENT AND PLAN:   Lashe was seen today for discomfort and spotting during intercourse.  Diagnoses and all  orders for this visit:  Vaginal bleeding, abnormal Possible causes discussed. Instructed about warning signs. Further recommendations according to lab and imaging results.  -     CBC -     TSH -     POCT urine pregnancy -     US PELVIC COMPLETE WITH TRANSVAGINAL; Future -     Cancel: Urine cytology ancillary only  Abdominal pain, unspecified abdominal location ? Constipation related. Other possible causes discussed. Will treat constipation and monitor for new symptoms. Pelvic examination and Hx do not suggest pelvic inflammatory disease.  -     CBC -     POCT urine pregnancy -     US PELVIC COMPLETE WITH TRANSVAGINAL; Future  Screen for STD (sexually transmitted disease) -     Chlamydia/Gonococcus/Trichomonas, NAA -     HIV Antibody (routine testing w rflx) -     Cancel: Urine cytology ancillary only  Constipation, unspecified constipation type Increase fiber and fluid intake. OTC Miralax daily as needed. Benefiber  Bid.  -     Basic metabolic panel -     TSH   Return if symptoms worsen or fail to improve.   -Ms.Jessicalynn Frieze was advised to seek immediate medical attention if sudden worsening symptoms or to follow if they persist or if new concerns arise.       Isabellamarie Randa G. [place], MD  [facility]. [place] office.

## 2019-04-25 LAB — BASIC METABOLIC PANEL
BUN: 17 mg/dL (ref 6–23)
CO2: 27 mEq/L (ref 19–32)
Calcium: 9.4 mg/dL (ref 8.4–10.5)
Chloride: 102 mEq/L (ref 96–112)
Creatinine, Ser: 0.77 mg/dL (ref 0.40–1.20)
GFR: 91.42 mL/min (ref >60.00)
Glucose, Bld: 77 mg/dL (ref 70–99)
Potassium: 3.7 mEq/L (ref 3.5–5.1)
Sodium: 136 mEq/L (ref 135–145)

## 2019-04-25 LAB — CBC
HCT: 37.1 % (ref 36.0–46.0)
Hemoglobin: 12.6 g/dL (ref 12.0–15.0)
MCHC: 33.9 g/dL (ref 30.0–36.0)
MCV: 94.4 fl (ref 78.0–100.0)
Platelets: 180 10*3/uL (ref 150.0–400.0)
RBC: 3.93 Mil/uL (ref 3.87–5.11)
RDW: 13.8 % (ref 11.5–15.5)
WBC: 4.9 10*3/uL (ref 4.0–10.5)

## 2019-04-25 LAB — TSH: TSH: 1.68 u[IU]/mL (ref 0.35–4.50)

## 2019-04-25 LAB — HIV ANTIBODY (ROUTINE TESTING W REFLEX): HIV 1&2 Ab, 4th Generation: NONREACTIVE

## 2019-04-28 ENCOUNTER — Encounter: Payer: Self-pay | Admitting: Family Medicine

## 2019-04-30 LAB — CHLAMYDIA/GONOCOCCUS/TRICHOMONAS, NAA
Chlamydia by NAA: NEGATIVE
Gonococcus by NAA: NEGATIVE
Trich vag by NAA: NEGATIVE

## 2019-05-01 ENCOUNTER — Encounter: Payer: Self-pay | Admitting: Physician Assistant

## 2019-05-01 ENCOUNTER — Other Ambulatory Visit: Payer: Self-pay

## 2019-05-01 ENCOUNTER — Ambulatory Visit (INDEPENDENT_AMBULATORY_CARE_PROVIDER_SITE_OTHER): Payer: BC Managed Care – PPO | Admitting: Physician Assistant

## 2019-05-01 ENCOUNTER — Encounter: Payer: Self-pay | Admitting: Family Medicine

## 2019-05-01 VITALS — BP 114/70 | HR 96 | Temp 98.2°F | Ht 65.0 in | Wt 163.1 lb

## 2019-05-01 DIAGNOSIS — K5900 Constipation, unspecified: Secondary | ICD-10-CM

## 2019-05-01 DIAGNOSIS — R1013 Epigastric pain: Secondary | ICD-10-CM | POA: Diagnosis not present

## 2019-05-01 DIAGNOSIS — R1084 Generalized abdominal pain: Secondary | ICD-10-CM

## 2019-05-01 DIAGNOSIS — K219 Gastro-esophageal reflux disease without esophagitis: Secondary | ICD-10-CM

## 2019-05-01 DIAGNOSIS — M069 Rheumatoid arthritis, unspecified: Secondary | ICD-10-CM

## 2019-05-01 DIAGNOSIS — IMO0002 Reserved for concepts with insufficient information to code with codable children: Secondary | ICD-10-CM

## 2019-05-01 DIAGNOSIS — Q796 Ehlers-Danlos syndrome, unspecified: Secondary | ICD-10-CM

## 2019-05-01 DIAGNOSIS — M329 Systemic lupus erythematosus, unspecified: Secondary | ICD-10-CM

## 2019-05-01 MED ORDER — HYOSCYAMINE SULFATE 0.125 MG SL SUBL: 0.1250 mg | SUBLINGUAL_TABLET | Freq: Four times a day (QID) | SUBLINGUAL | 1 refills | Status: DC | PRN

## 2019-05-01 NOTE — Progress Notes (Signed)
Agree with assessment and plan. I suspect functional symptoms but will await her course and imaging.

## 2019-05-01 NOTE — Patient Instructions (Signed)
If you are age 25 or older, your body mass index should be between 23-30. Your Body mass index is 27.15 kg/m. If this is out of the aforementioned range listed, please consider follow up with your Primary Care Provider.  If you are age 61 or younger, your body mass index should be between 19-25. Your Body mass index is 27.15 kg/m. If this is out of the aformentioned range listed, please consider follow up with your Primary Care Provider.   We have sent the following medications to your pharmacy for you to pick up at your convenience: Levsin  Miralax 17 gm in 8 ounces of water daily.  You have been scheduled for a CT scan of the abdomen and pelvis at  [facility].  You are scheduled on 05/08/19 at 8:30 am. You should arrive 15 minutes prior to your appointment time for registration. Please follow the written instructions below on the day of your exam:  WARNING: IF YOU ARE ALLERGIC TO IODINE/X-RAY DYE, PLEASE NOTIFY RADIOLOGY IMMEDIATELY AT [phone]! YOU WILL BE GIVEN A 13 HOUR PREMEDICATION PREP.  1) Do not eat or drink anything after 4:30 am (4 hours prior to your test) 2) You have been given 2 bottles of oral contrast to drink. The solution may taste better if refrigerated, but do NOT add ice or any other liquid to this solution. Shake well before drinking.    Drink 1 bottle of contrast @ 6:30 am (2 hours prior to your exam)  Drink 1 bottle of contrast @ 7:30 am (1 hour prior to your exam)  You may take any medications as prescribed with a small amount of water, if necessary. If you take any of the following medications: METFORMIN, GLUCOPHAGE, GLUCOVANCE, AVANDAMET, RIOMET, FORTAMET, ACTOPLUS MET, JANUMET, GLUMETZA or METAGLIP, you MAY be asked to HOLD this medication 48 hours AFTER the exam.   This test typically takes 30-45 minutes to complete.  If you have any questions regarding your exam or if you need to reschedule, you may call the CT department at [phone]  between the hours of 8:00 am and 5:00 pm, Monday-Friday.  Follow up with Dr. Havery Moros in 2-3 weeks.  Please call the office for an appointment as the schedule is not available at this time.  Thank you for choosing me and [facility].  Amy Esterwood, PA-C

## 2019-05-01 NOTE — Progress Notes (Signed)
Subjective:    Patient ID: Joy Taylor, female    DOB: [birth year], 23 y.o.   MRN: [id number]  HPI Joy Taylor is a pleasant 23 year old white female, known to Dr. Havery Moros who was last seen in the office in January 2020.  She has history of chronic GERD for which she underwent a Nissen fundoplication in 8119.  And also has chronic dyspeptic symptoms in setting of underlying rheumatoid arthritis for which she is on Cimzia, fibromyalgia, diagnosis of SLE and Ehlers Danlos syndrome. She had EGD in May 2019 which was a normal exam post Nissen fundoplication with no evidence for H. pylori.  Gastric emptying scan in May 2019 also normal and CT of the abdomen 2018-. She has been felt in the past to likely have a gas bloat type sure post Nissen and has been on buspirone 15 mg at bedtime which she has found somewhat helpful.  He had been on Pepcid in the past and also omeprazole but ultimately has not stayed on either of these because she did not feel that they helped.  She was prescribed Carafate at the time of her last office visit for dyspepsia but was unable to get this due to cost.  She is now taking aloe vera juice for dyspepsia but says it seems to help a bit. She comes in today with concerns about continued episodes of squeezing epigastric pain which is sometimes sharp and she thinks this is exacerbated by either eating too much or too quickly.  She is also having some achy discomfort in her mid abdomen associated with bloating.  This is been occurring periodically recently and may last for hours.  Her bowels have been somewhat more constipated with hard stools and a lot of straining. She did baseline labs through her PCP on 04/24/2019 all were within normal limits. Her only new medication is Lexapro which she started in April, low-dose 10 mg which is helping her depression.  No other changes in her usual regimen.  Review of Systems Pertinent positive and negative review of systems were noted in the above  HPI section.  All other review of systems was otherwise negative.  Outpatient Encounter Medications as of 05/01/2019  Medication Sig  . ALOE VERA JUICE PO Take by mouth daily. 1 tablespoon. May take up to 3 tablespoons if bad  . aspirin-acetaminophen-caffeine (EXCEDRIN MIGRAINE) 250-250-65 MG tablet Take 2 tablets by mouth every 6 (six) hours as needed for headache.   . busPIRone (BUSPAR) 15 MG tablet Take 1 tablet (15 mg total) by mouth at bedtime.  . Certolizumab Pegol (CIMZIA PREFILLED) 2 X 200 MG/ML KIT Inject 400 mg into the skin every 30 (thirty) days. Last shot given 04/28/2019  . divalproex (DEPAKOTE) 500 MG DR tablet Take 1 tablet (500 mg total) by mouth 2 (two) times daily.  Marland Kitchen escitalopram (LEXAPRO) 10 MG tablet Take 10 mg by mouth daily.  . fluticasone (FLONASE) 50 MCG/ACT nasal spray Place 2 sprays into both nostrils as needed (tries to take every other day).   Marland Kitchen FML FORTE 0.25 % ophthalmic suspension   . gabapentin (NEURONTIN) 600 MG tablet TAKE 1 TABLET BY MOUTH THREE TIMES DAILY  . glucose monitoring kit (FREESTYLE) monitoring kit 1 each by Does not apply route as needed for other.  . JUNEL 1.5/30 1.5-30 MG-MCG tablet TAKE 1 TABLET BY MOUTH EVERY DAY  . MELATONIN PO Take 2 tablets by mouth as needed.  . Multiple Vitamins-Minerals (MULTIVITAMIN WOMEN PO) Take 1 tablet by mouth daily.  Marland Kitchen  ondansetron (ZOFRAN ODT) 4 MG disintegrating tablet Take 1 tablet (4 mg total) by mouth every 6 (six) hours as needed for nausea or vomiting.  . pilocarpine (SALAGEN) 5 MG tablet Take 5 mg by mouth 4 (four) times daily as needed (for dry mouth).  Marland Kitchen REFRESH CELLUVISC 1 % GEL 1 drop 2 (two) times daily.  Carren Rang BALANCE 0.6 % SOLN   . Erenumab-aooe (AIMOVIG) 70 MG/ML SOAJ Inject 70 mg into the skin every 30 (thirty) days. (Patient not taking: Reported on 05/01/2019)  . hyoscyamine (LEVSIN SL) 0.125 MG SL tablet Place 1 tablet (0.125 mg total) under the tongue every 6 (six) hours as needed. As needed  for abdominal pain, cramping/spasm  . [DISCONTINUED] acetaminophen (TYLENOL) 325 MG tablet Take 2 tablets (650 mg total) by mouth every 6 (six) hours as needed. Do not take more than 4063m of tylenol per day  . [DISCONTINUED] AMBULATORY NON FORMULARY MEDICATION GI Cocktail -  90 ml 2% Lidocaine                       90 ml Dicyclomine 147m5ml                      270 ml Maalox    Take 5-10 ml every 4 hrs as needed.  . [DISCONTINUED] famotidine (PEPCID) 20 MG tablet Take 20 mg by mouth 2 (two) times daily.  . [DISCONTINUED] omeprazole (PRILOSEC) 40 MG capsule Take 1 capsule (40 mg total) by mouth daily.  . [DISCONTINUED] sucralfate (CARAFATE) 1 GM/10ML suspension Take 10 mLs (1 g total) by mouth every 8 (eight) hours as needed.   No facility-administered encounter medications on file as of 05/01/2019.    No Known Allergies Patient Active Problem List   Diagnosis Date Noted  . Ehlers-Danlos disease 04/19/2017  . Rheumatoid arthritis ([place]/02/2017  . Systemic lupus erythematosus ([place]06/02/2017  . Fibromyalgia 04/19/2017  . Migraine 04/19/2017   Social History   Socioeconomic History  . Marital status: Single    Spouse name: Not on file  . Number of children: 0  . Years of education: 1441. Highest education level: Not on file  Occupational History  . Occupation: reOptometristSocial Needs  . Financial resource strain: Not on file  . Food insecurity    Worry: Not on file    Inability: Not on file  . Transportation needs    Medical: Not on file    Non-medical: Not on file  Tobacco Use  . Smoking status: Never Smoker  . Smokeless tobacco: Never Used  Substance and Sexual Activity  . Alcohol use: Yes    Comment: rare-occ. per pt  . Drug use: No  . Sexual activity: Yes  Lifestyle  . Physical activity    Days per week: Not on file    Minutes per session: Not on file  . Stress: Not on file  Relationships  . Social coHerbalistn phone: Not on file    Gets  together: Not on file    Attends religious service: Not on file    Active member of club or organization: Not on file    Attends meetings of clubs or organizations: Not on file    Relationship status: Not on file  . Intimate partner violence    Fear of current or ex partner: Not on file    Emotionally abused: Not on file    Physically abused:  Not on file    Forced sexual activity: Not on file  Other Topics Concern  . 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 Reports this triggered all of her health problems.    Joy Taylor's family history includes Asthma in an other family member; Bone cancer in her father; Colon polyps in her mother; Diabetes in her mother; Diverticulitis in her mother; Heart disease in her maternal grandfather, maternal grandmother, and another family member; Hypertension in her maternal grandfather; Macular degeneration in her maternal grandmother; Obesity in an other family member.      Objective:    Vitals:   05/01/19 1518  BP: 114/70  Pulse: 96  Temp: 98.2 F (36.8 C)    Physical Exam; well-developed young white female in no acute distress, pale complexion  BMI 27.1.  HEENT; nontraumatic normocephalic EOMI PERRLA sclera anicteric, oral mucosa benign neck supple, cardiovascular; regular rate and rhythm with S1-S2 no murmur rub or gallop, Pulmonary ;clear bilaterally, Abdomen ;soft, there is some mild tenderness in the left mid quadrant no guarding or rebound no palpable mass or hepatosplenomegaly bowel sounds are present, Rectal; exam not done, Extremities; pale, no clubbing cyanosis or edema skin warm and dry, Neuropsych; alert and oriented, grossly nonfocal mood and affect appropriate       Assessment & Plan:   #80 23 year old white female with chronic dyspepsia  status post Nissen fundoplication 1561.  Symptoms in the past felt to be most likely a gas bloat type comment on post Nissen. 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  #2 63-monthhistory of periodic achy mid abdominal discomfort and bloating with underlying constipation.  Suspect functional/IBS  #3 rheumatoid arthritis, on Cimzia 4.  History of fibromyalgia 5.  Migraine headaches 6.  SLE 7.  Ehlers Danlos syndrome  Plan; Patient has not found PPIs or H2 blockers helpful- leave off, and she will continue aloe vera juice. Continue buspirone 15 mg nightly Add trial of Levsin sublingual every 6 hours as needed for epigastric or mid abdominal pain/bloating Schedule for CT scan of the abdomen and pelvis with contrast Patient will follow-up with Dr. AHavery Morosin 3 to 4 weeks.  Amy SGenia HaroldPA-C 05/01/2019   Cc: KLucretia Kern DO

## 2019-05-08 ENCOUNTER — Other Ambulatory Visit: Payer: Self-pay

## 2019-05-08 ENCOUNTER — Ambulatory Visit (HOSPITAL_COMMUNITY)
Admission: RE | Admit: 2019-05-08 | Discharge: 2019-05-08 | Disposition: A | Discharge status: Home / Self Care | Payer: BC Managed Care – PPO | Source: Ambulatory Visit | Attending: Physician Assistant | Admitting: Physician Assistant

## 2019-05-08 DIAGNOSIS — K5900 Constipation, unspecified: Secondary | ICD-10-CM | POA: Diagnosis present

## 2019-05-08 DIAGNOSIS — M069 Rheumatoid arthritis, unspecified: Secondary | ICD-10-CM | POA: Diagnosis present

## 2019-05-08 DIAGNOSIS — R1013 Epigastric pain: Secondary | ICD-10-CM | POA: Diagnosis not present

## 2019-05-08 DIAGNOSIS — M329 Systemic lupus erythematosus, unspecified: Secondary | ICD-10-CM | POA: Diagnosis present

## 2019-05-08 DIAGNOSIS — K219 Gastro-esophageal reflux disease without esophagitis: Secondary | ICD-10-CM | POA: Diagnosis present

## 2019-05-08 DIAGNOSIS — Q796 Ehlers-Danlos syndrome, unspecified: Secondary | ICD-10-CM | POA: Diagnosis present

## 2019-05-08 DIAGNOSIS — R1084 Generalized abdominal pain: Secondary | ICD-10-CM | POA: Diagnosis present

## 2019-05-08 MED ORDER — IOHEXOL 300 MG/ML  SOLN
100.0000 mL | Freq: Once | INTRAMUSCULAR | Status: AC | PRN
  Administered 2019-05-08: 100 mL via INTRAVENOUS

## 2019-05-08 MED ORDER — SODIUM CHLORIDE (PF) 0.9 % IJ SOLN
INTRAMUSCULAR | Status: AC
  Filled 2019-05-08: qty 50

## 2019-05-09 ENCOUNTER — Ambulatory Visit: Admitting: Neurology

## 2019-05-15 ENCOUNTER — Other Ambulatory Visit: Payer: BC Managed Care – PPO

## 2019-06-26 ENCOUNTER — Telehealth: Payer: Self-pay | Admitting: Neurology

## 2019-06-26 NOTE — Telephone Encounter (Signed)
I called patient and LVM regarding rescheduling 8/13 appt due to Judson Roch being out [employer]. Requested patient call back to r/s.

## 2019-06-27 ENCOUNTER — Encounter: Payer: Self-pay | Admitting: Neurology

## 2019-06-27 NOTE — Telephone Encounter (Signed)
Appointment has been cancelled, unable to get in contact with patient. Cancellation letter sent today to ensure timely delivery before day of previously scheduled appt.

## 2019-06-29 ENCOUNTER — Ambulatory Visit: Payer: BLUE CROSS/BLUE SHIELD | Admitting: Neurology

## 2019-08-11 ENCOUNTER — Other Ambulatory Visit: Payer: Self-pay | Admitting: Orthopedic Surgery

## 2019-08-11 DIAGNOSIS — R531 Weakness: Secondary | ICD-10-CM

## 2019-08-11 DIAGNOSIS — M25561 Pain in right knee: Secondary | ICD-10-CM

## 2019-08-15 NOTE — Progress Notes (Signed)
° ° °PATIENT: Joy Taylor °DOB: 11/26/1995 ° °REASON FOR VISIT: follow up °HISTORY FROM: patient ° °HISTORY OF PRESENT ILLNESS: °Today 08/16/19 °Joy Taylor is a 23-year-old female with history of fibromyalgia and migraine headaches.  She remains on Depakote 500 mg twice a day, gabapentin 600 mg 3 times a day.  She was prescribed Aimovig after her last visit, but did not get the medication.  MRI of the brain was ordered but was not completed.  She continues to follow with rheumatology for RA.  She indicates she is having daily headaches.  She recently received her RA injection, which results in an increase in headaches.  She continues taking Lexapro for depression.  In the past she has tried Cymbalta for fibromyalgia, but had undesirable side effects.  She says she is also tried nortriptyline.  She indicates she feels her memory is still not as good.  She denies any numbness to her arms or legs. She is agreeable to transitioning off Depakote due to the risk of becoming pregnant while taking medication.  She is taking birth control pills.  She is currently in college.  She reports she does try to exercise on a fairly regular basis by using her stationary bike and walking.  She presents today for follow-up unaccompanied. ° °HISTORY  °03/09/2019 SS: Joy Taylor is a 22-year-old female with history of fibromyalgia migraine headaches.  At her last visit she was to schedule MRI of the brain, complaining of a new type of headache to the left side of her face.  She is currently taking Depakote 500 mg twice daily, gabapentin 600 mg 3 times a day. In the past she has been on Cymbalta, but had cognitive side effects.  She reports she is been having more frequent headaches, she was recently started on Lexapro by her psychiatrist.  She reports that she is currently having about 3-4 headaches per week, as result of Lexapro.  She is currently in [place] visiting her parents, she reports she will be there for several weeks.  She was to  have MRI of the brain however this was never set up. ° °REVIEW OF SYSTEMS: Out of a complete 14 system review of symptoms, the patient complains only of the following symptoms, and all other reviewed systems are negative. ° °Headache ° °ALLERGIES: °No Known Allergies ° °HOME MEDICATIONS: °Outpatient Medications Prior to Visit  °Medication Sig Dispense Refill  °• ALOE VERA JUICE PO Take by mouth daily. 1 tablespoon. May take up to 3 tablespoons if bad    °• aspirin-acetaminophen-caffeine (EXCEDRIN MIGRAINE) 250-250-65 MG tablet Take 2 tablets by mouth every 6 (six) hours as needed for headache.     °• busPIRone (BUSPAR) 15 MG tablet Take 1 tablet (15 mg total) by mouth at bedtime. 30 tablet 5  °• Certolizumab Pegol (CIMZIA PREFILLED) 2 X 200 MG/ML KIT Inject 400 mg into the skin every 30 (thirty) days. Last shot given 04/28/2019    °• escitalopram (LEXAPRO) 10 MG tablet Take 10 mg by mouth daily.    °• fluticasone (FLONASE) 50 MCG/ACT nasal spray Place 2 sprays into both nostrils as needed (tries to take every other day).     °• FML FORTE 0.25 % ophthalmic suspension     °• glucose monitoring kit (FREESTYLE) monitoring kit 1 each by Does not apply route as needed for other. 1 each 0  °• hyoscyamine (LEVSIN SL) 0.125 MG SL tablet Place 1 tablet (0.125 mg total) under the tongue every 6 (six) hours   hours as needed. As needed for abdominal pain, cramping/spasm 30 tablet 1   JUNEL 1.5/30 1.5-30 MG-MCG tablet TAKE 1 TABLET BY MOUTH EVERY DAY 63 tablet 3   MELATONIN PO Take 2 tablets by mouth as needed.     Multiple Vitamins-Minerals (MULTIVITAMIN WOMEN PO) Take 1 tablet by mouth daily.     ondansetron (ZOFRAN ODT) 4 MG disintegrating tablet Take 1 tablet (4 mg total) by mouth every 6 (six) hours as needed for nausea or vomiting. 30 tablet 3   pilocarpine (SALAGEN) 5 MG tablet Take 5 mg by mouth 4 (four) times daily as needed (for dry mouth).     REFRESH CELLUVISC 1 % GEL 1 drop 2 (two) times daily.     SYSTANE  BALANCE 0.6 % SOLN      divalproex (DEPAKOTE) 500 MG DR tablet Take 1 tablet (500 mg total) by mouth 2 (two) times daily. 180 tablet 0   gabapentin (NEURONTIN) 600 MG tablet TAKE 1 TABLET BY MOUTH THREE TIMES DAILY 90 tablet 3   Erenumab-aooe (AIMOVIG) 70 MG/ML SOAJ Inject 70 mg into the skin every 30 (thirty) days. (Patient not taking: Reported on 08/16/2019) 1 pen 3   No facility-administered medications prior to visit.     PAST MEDICAL HISTORY: Past Medical History:  Diagnosis Date   Anxiety    Depression    Ehlers-Danlos disease    Fibromyalgia    Genital herpes    diagnosed by GYN-per patient no treatment advised unless daily flare ups occur   GERD (gastroesophageal reflux disease)    IBS (irritable bowel syndrome)    Kidney stones    Low BP    post-op   Lupus (HCC)    Migraine    Rheumatoid arthritis ([place])    Stomach problems     PAST SURGICAL HISTORY: Past Surgical History:  Procedure Laterality Date   HIP ARTHROSCOPY Right 07/15/2016, 08/06/2015   KNEE ARTHROSCOPY Right    NISSEN FUNDOPLICATION  69/62/9528   ROTATOR CUFF REPAIR Right 2010   SHOULDER ARTHROSCOPY Right 06/29/2018    FAMILY HISTORY: Family History  Problem Relation Age of Onset   Diabetes Mother    Diverticulitis Mother    Colon polyps Mother    Macular degeneration Maternal Grandmother    Heart disease Maternal Grandmother    Hypertension Maternal Grandfather    Heart disease Maternal Grandfather    Bone cancer Father    Asthma Other    Obesity Other    Heart disease Other    Colon cancer Neg Hx    Esophageal cancer Neg Hx    Pancreatic cancer Neg Hx    Stomach cancer Neg Hx    Liver disease Neg Hx    Rectal cancer Neg Hx     SOCIAL HISTORY: Social History   Socioeconomic History   Marital status: Single    Spouse name: Not on file   Number of children: 0   Years of education: 14   Highest education level: Not on file  Occupational  History   Occupation: Lobbyist strain: Not on file   Food insecurity    Worry: Not on file    Inability: Not on file   Transportation needs    Medical: Not on file    Non-medical: Not on file  Tobacco Use   Smoking status: Never Smoker   Smokeless tobacco: Never Used  Substance and Sexual Activity   Alcohol use: Yes  Comment: rare-occ. per pt   Drug use: No   Sexual activity: Yes  Lifestyle   Physical activity    Days per week: Not on file    Minutes per session: Not on file   Stress: Not on file  Relationships   Social connections    Talks on phone: Not on file    Gets together: Not on file    Attends religious service: Not on file    Active member of club or organization: Not on file    Attends meetings of clubs or organizations: Not on file    Relationship status: Not on file   Intimate partner violence    Fear of current or ex partner: Not on file    Emotionally abused: Not on file    Physically abused: Not on file    Forced sexual activity: Not on file  Other Topics Concern   Not on file  Social History Narrative      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 Reports this triggered all of her health problems.     PHYSICAL EXAM  Vitals:   08/16/19 0804  BP: 110/90  Pulse: 70  Temp: 97.8 F (36.6 C)  Weight: 169 lb (76.7 kg)   Body mass index is 28.12 kg/m.  Generalized: Well developed, in no acute distress   Neurological examination  Mentation: Alert oriented to time, place, history taking. Follows all commands speech and language fluent Cranial nerve II-XII: Pupils were equal round reactive to light. Extraocular movements were full, visual field were full on confrontational test. Facial sensation and strength were normal. Head turning and shoulder  shrug  were normal and symmetric. Motor: The motor testing reveals 5 over 5 strength of all 4 extremities. Good symmetric motor tone is noted throughout.  Sensory: Sensory testing is intact to soft touch on all 4 extremities. No evidence of extinction is noted.  Coordination: Cerebellar testing reveals good finger-nose-finger and heel-to-shin bilaterally.  Gait and station: Gait is normal. Tandem gait is normal. Romberg is negative. No drift is seen.  Reflexes: Deep tendon reflexes are symmetric and normal bilaterally.   DIAGNOSTIC DATA (LABS, IMAGING, TESTING) - I reviewed patient records, labs, notes, testing and imaging myself where available.  Lab Results  Component Value Date   WBC 4.9 04/24/2019   HGB 12.6 04/24/2019   HCT 37.1 04/24/2019   MCV 94.4 04/24/2019   PLT 180.0 04/24/2019      Component Value Date/Time   NA 136 04/24/2019 1545   NA 139 10/11/2018 1215   K 3.7 04/24/2019 1545   CL 102 04/24/2019 1545   CO2 27 04/24/2019 1545   GLUCOSE 77 04/24/2019 1545   BUN 17 04/24/2019 1545   BUN 13 10/11/2018 1215   CREATININE 0.77 04/24/2019 1545   CALCIUM 9.4 04/24/2019 1545   PROT 6.8 10/11/2018 1215   ALBUMIN 4.3 10/11/2018 1215   AST 17 10/11/2018 1215   ALT 9 10/11/2018 1215   ALKPHOS 34 (L) 10/11/2018 1215   BILITOT 0.3 10/11/2018 1215   GFRNONAA 122 10/11/2018 1215   GFRAA 140 10/11/2018 1215   No results found for: CHOL, HDL, LDLCALC, LDLDIRECT, TRIG, CHOLHDL No results found for: HGBA1C No results found for: VITAMINB12 Lab Results  Component Value Date   TSH 1.68  04/24/2019    ASSESSMENT AND PLAN 23 y.o. year old female  has a past medical history of Anxiety, Depression, Ehlers-Danlos disease, Fibromyalgia, Genital herpes, GERD (gastroesophageal reflux disease), IBS (irritable bowel syndrome), Kidney stones, Low BP, Lupus ([place]), Migraine, Rheumatoid arthritis ([place]), and Stomach problems. here with:  1.  Migraine headache 2.  Fibromyalgia  I will  reorder Aimovig 70 mg monthly injection for migraine headache.  We discussed that the goal is to try to get her off Depakote, due to the fact that this is a category X medication for pregnancy.  She is to take daily folic acid 1 mg. She will start Aimovig, after 3 monthly injections, we will wean her off Depakote.  Today, I will check routine lab work while on Depakote (ammonia has been checked in the past and was normal).  She will continue taking gabapentin 600 mg 3 times a day for fibromyalgia.  I have encouraged her to continue exercising, consider yoga or stretching for fibromyalgia.  She also follows with rheumatology for RA and lupus.  We will proceed with MRI of the brain, that was ordered after last visit (for memory problems and headache).  She will follow-up in 3 months or sooner if needed.  I did advise her symptoms worsen or she develops any new symptoms she should let us know.  I spent 25 minutes with the patient. 50% of this time was spent discussing her plan of care.   Butler Denmark, AGNP-C, DNP 08/16/2019, 8:47 AM [facility] [address] [place], [place] [address] [phone]

## 2019-08-16 ENCOUNTER — Telehealth: Payer: Self-pay | Admitting: Neurology

## 2019-08-16 ENCOUNTER — Ambulatory Visit (INDEPENDENT_AMBULATORY_CARE_PROVIDER_SITE_OTHER): Payer: BC Managed Care – PPO | Admitting: Neurology

## 2019-08-16 ENCOUNTER — Encounter: Payer: Self-pay | Admitting: Neurology

## 2019-08-16 ENCOUNTER — Other Ambulatory Visit: Payer: Self-pay

## 2019-08-16 VITALS — BP 110/90 | HR 70 | Temp 97.8°F | Wt 169.0 lb

## 2019-08-16 DIAGNOSIS — G43809 Other migraine, not intractable, without status migrainosus: Secondary | ICD-10-CM | POA: Diagnosis not present

## 2019-08-16 DIAGNOSIS — M797 Fibromyalgia: Secondary | ICD-10-CM

## 2019-08-16 MED ORDER — AIMOVIG 70 MG/ML ~~LOC~~ SOAJ: 70.0000 mg | SUBCUTANEOUS | 6 refills | Status: DC

## 2019-08-16 MED ORDER — DIVALPROEX SODIUM 500 MG PO DR TAB: 500.0000 mg | DELAYED_RELEASE_TABLET | Freq: Two times a day (BID) | ORAL | 0 refills | Status: DC

## 2019-08-16 MED ORDER — GABAPENTIN 600 MG PO TABS: 600.0000 mg | ORAL_TABLET | Freq: Three times a day (TID) | ORAL | 6 refills | Status: DC

## 2019-08-16 NOTE — Patient Instructions (Signed)
1. Continue gabapentin  2. Start Aimovig  3. We will try to get you off Depakote in the future 4. We will get MRI of the brain  5. Check lab work today 6. Return in 3 months

## 2019-08-16 NOTE — Telephone Encounter (Signed)
I also spoke with [place] with [facility].

## 2019-08-16 NOTE — Progress Notes (Signed)
I have read the note, and I agree with the clinical assessment and plan.  Calleigh Lafontant K Berkeley Veldman   

## 2019-08-16 NOTE — Telephone Encounter (Signed)
BCBS Auth: NPR Ref # [id number] order sent to [facility]. They will reach out to the patient to schedule.

## 2019-08-17 ENCOUNTER — Telehealth: Payer: Self-pay | Admitting: *Deleted

## 2019-08-17 LAB — COMPREHENSIVE METABOLIC PANEL
ALT: 12 IU/L (ref 0–32)
AST: 16 IU/L (ref 0–40)
Albumin/Globulin Ratio: 1.6 (ref 1.2–2.2)
Albumin: 4.3 g/dL (ref 3.9–5.0)
Alkaline Phosphatase: 35 IU/L — ABNORMAL LOW (ref 39–117)
BUN/Creatinine Ratio: 17 (ref 9–23)
BUN: 13 mg/dL (ref 6–20)
Bilirubin Total: 0.2 mg/dL (ref 0.0–1.2)
CO2: 20 mmol/L (ref 20–29)
Calcium: 9.9 mg/dL (ref 8.7–10.2)
Chloride: 106 mmol/L (ref 96–106)
Creatinine, Ser: 0.75 mg/dL (ref 0.57–1.00)
GFR calc Af Amer: 128 mL/min/{1.73_m2} (ref >59)
GFR calc non Af Amer: 111 mL/min/{1.73_m2} (ref >59)
Globulin, Total: 2.7 g/dL (ref 1.5–4.5)
Glucose: 83 mg/dL (ref 65–99)
Potassium: 3.9 mmol/L (ref 3.5–5.2)
Sodium: 140 mmol/L (ref 134–144)
Total Protein: 7 g/dL (ref 6.0–8.5)

## 2019-08-17 LAB — VALPROIC ACID LEVEL: Valproic Acid Lvl: 68 ug/mL (ref 50–100)

## 2019-08-17 LAB — CBC WITH DIFFERENTIAL/PLATELET
Basophils Absolute: 0.1 10*3/uL (ref 0.0–0.2)
Basos: 1 %
EOS (ABSOLUTE): 0.2 10*3/uL (ref 0.0–0.4)
Eos: 3 %
Hematocrit: 36.1 % (ref 34.0–46.6)
Hemoglobin: 12 g/dL (ref 11.1–15.9)
Immature Grans (Abs): 0 10*3/uL (ref 0.0–0.1)
Immature Granulocytes: 0 %
Lymphocytes Absolute: 3.2 10*3/uL — ABNORMAL HIGH (ref 0.7–3.1)
Lymphs: 48 %
MCH: 30.7 pg (ref 26.6–33.0)
MCHC: 33.2 g/dL (ref 31.5–35.7)
MCV: 92 fL (ref 79–97)
Monocytes Absolute: 0.8 10*3/uL (ref 0.1–0.9)
Monocytes: 12 %
Neutrophils Absolute: 2.4 10*3/uL (ref 1.4–7.0)
Neutrophils: 36 %
Platelets: 213 10*3/uL (ref 150–450)
RBC: 3.91 x10E6/uL (ref 3.77–5.28)
RDW: 12.8 % (ref 11.7–15.4)
WBC: 6.6 10*3/uL (ref 3.4–10.8)

## 2019-08-17 NOTE — Telephone Encounter (Signed)
Started Aimovig PA on CMM, key: [id number], dx: G20.809. Failed: nortriptyline, sumatriptan 0.6mg  injection (on 04/2017-06/2018). Your PA has been faxed to the plan as a paper copy. Please contact the plan directly if you haven't received a determination in a typical timeframe. You will be notified of the determination electronically and via fax.

## 2019-08-17 NOTE — Telephone Encounter (Signed)
LVM informing patient her labs look stable, and the Depakote level is within normal range. NP started aimovig yesterday and is planning to try and get her off Depakote in the future. Left number for questions.

## 2019-08-21 NOTE — Telephone Encounter (Addendum)
Received fax from [facility] with additional questions re: patient's migraines. Called patient and requested she call back.

## 2019-08-23 NOTE — Telephone Encounter (Addendum)
Received approval for aimovig 70mg /ml from 08-23-19 thru 02-21-20. PA # [id number], ID [id number]. Fax confirmation received [facility] [phone].  LMVM for pt that was approved and should be able to pick up.  She is to call back if questions.

## 2019-08-28 NOTE — Telephone Encounter (Signed)
BCBS Auth: [id number] (exp. 08/28/19 to 11/25/19) patient is scheduled at [facility] for 09/09/19.

## 2019-08-30 ENCOUNTER — Other Ambulatory Visit: Payer: BC Managed Care – PPO

## 2019-09-09 ENCOUNTER — Ambulatory Visit
Admission: RE | Admit: 2019-09-09 | Discharge: 2019-09-09 | Disposition: A | Discharge status: Home / Self Care | Payer: BC Managed Care – PPO | Source: Ambulatory Visit | Attending: Neurology | Admitting: Neurology

## 2019-09-09 ENCOUNTER — Other Ambulatory Visit: Payer: Self-pay

## 2019-09-09 DIAGNOSIS — R519 Headache, unspecified: Secondary | ICD-10-CM

## 2019-09-09 DIAGNOSIS — G8929 Other chronic pain: Secondary | ICD-10-CM

## 2019-09-11 ENCOUNTER — Telehealth: Payer: Self-pay

## 2019-09-11 NOTE — Telephone Encounter (Signed)
Called pt with results of her recent MRI   "Result Notes for MR BRAIN WO CONTRAST  Notes recorded by Suzzanne Cloud, NP on 09/11/2019 at 12:45 PM EDT  Please call the patient for normal MRI of the brain."  -Per NP Sarah slack

## 2019-09-14 ENCOUNTER — Other Ambulatory Visit: Payer: BC Managed Care – PPO

## 2019-09-27 ENCOUNTER — Ambulatory Visit
Admission: RE | Admit: 2019-09-27 | Discharge: 2019-09-27 | Disposition: A | Discharge status: Home / Self Care | Payer: BC Managed Care – PPO | Source: Ambulatory Visit | Attending: Orthopedic Surgery | Admitting: Orthopedic Surgery

## 2019-09-27 ENCOUNTER — Other Ambulatory Visit: Payer: Self-pay

## 2019-09-27 DIAGNOSIS — M25561 Pain in right knee: Secondary | ICD-10-CM

## 2019-09-27 DIAGNOSIS — R531 Weakness: Secondary | ICD-10-CM

## 2019-11-19 NOTE — Progress Notes (Signed)
Virtual Visit via Video Note  I connected with Joy Taylor on 11/20/19 at  2:15 PM EST by a video enabled telemedicine application and verified that I am speaking with the correct person using two identifiers.  Location: Patient: at her home Provider: in the office    I discussed the limitations of evaluation and management by telemedicine and the availability of in person appointments. The patient expressed understanding and agreed to proceed.  History of Present Illness: 11/20/2019 SS: Joy Taylor is a 24 year old female with history of fibromyalgia and migraine headaches.  She also has history of rheumatoid arthritis and lupus.  MRI of the brain was normal.  She remains on Depakote, gabapentin, and Aimovig.  She has had good benefit with Aimovig, reports a 75% reduction in her headaches.  She says she now only has headaches around her menstrual cycle.  She does report a side effect of feeling that her brain is shivering if she is late on taking her Lexapro.  She does report constipation with Aimovig, but is not interested in switching medications.  She reports for her headaches, she will take Excedrin Migraine, or use essential oils or herbal tea.  For fibromyalgia, she remains on gabapentin, she has tried Cymbalta and nortriptyline previously without benefit.  She presents today for follow-up via telephone, she was unable to login for my chart visit.  08/16/2019 SS: Joy Taylor is a 24 year old female with history of fibromyalgia and migraine headaches.  She remains on Depakote 500 mg twice a day, gabapentin 600 mg 3 times a day.  She was prescribed Aimovig after her last visit, but did not get the medication.  MRI of the brain was ordered but was not completed.  She continues to follow with rheumatology for RA.  She indicates she is having daily headaches.  She recently received her RA injection, which results in an increase in headaches.  She continues taking Lexapro for depression.  In the past she  has tried Cymbalta for fibromyalgia, but had undesirable side effects.  She says she is also tried nortriptyline.  She indicates she feels her memory is still not as good.  She denies any numbness to her arms or legs. She is agreeable to transitioning off Depakote due to the risk of becoming pregnant while taking medication.  She is taking birth control pills.  She is currently in college.  She reports she does try to exercise on a fairly regular basis by using her stationary bike and walking.  She presents today for follow-up unaccompanied.   Observations/Objective: Via telephone visit, is alert and oriented, speech is clear and concise, answers questions appropriately  Assessment and Plan: 1.  Migraine headache  2.  Fibromyalgia  She has had good benefit with Aimovig 70 mg monthly injection, but has had some constipation.  She wishes to remain on Aimovig vs try another CGRP, she will incorporate fiber, drink plenty of water, stool softener or MiraLAX.  She has had a 75% improvement in her headaches.  At this point, she will start to come off Depakote, she will take 500 mg daily for 3 weeks, then stop the medication.  Her headaches now centered around her menstrual cycle, if this continues we may try treating her menstrual migraines with Frova 2.5 mg daily 2 days prior to her cycle, for total of 5 days. For now, we will make 1 change by getting her off Depakote for migraine headaches.  She mentions a specialty fibromyalgia clinic in [place] she  may go to for consultation, she is under the care of rheumatology, she has history of lupus and rheumatoid arthritis.  She remains on gabapentin, was unable to tolerate Cymbalta or nortriptyline previously.  She will follow-up in 4 months or sooner if needed.   Follow Up Instructions: 4 months 03/20/2020 2:15 pm   I discussed the assessment and treatment plan with the patient. The patient was provided an opportunity to ask questions and all were answered. The  patient agreed with the plan and demonstrated an understanding of the instructions.   The patient was advised to call back or seek an in-person evaluation if the symptoms worsen or if the condition fails to improve as anticipated.  I provided 19 minutes of non-face-to-face time during this encounter.  Evangeline Dakin, DNP  [facility] [address] [place], [place] [address] [phone]

## 2019-11-20 ENCOUNTER — Telehealth (INDEPENDENT_AMBULATORY_CARE_PROVIDER_SITE_OTHER): Payer: BC Managed Care – PPO | Admitting: Neurology

## 2019-11-20 ENCOUNTER — Other Ambulatory Visit: Payer: Self-pay

## 2019-11-20 ENCOUNTER — Encounter: Payer: Self-pay | Admitting: Neurology

## 2019-11-20 DIAGNOSIS — G43809 Other migraine, not intractable, without status migrainosus: Secondary | ICD-10-CM

## 2019-11-20 MED ORDER — AIMOVIG 70 MG/ML ~~LOC~~ SOAJ: 70.0000 mg | SUBCUTANEOUS | 6 refills | Status: DC

## 2019-11-20 NOTE — Patient Instructions (Signed)
Start taking Depakote 500 mg once daily x  3 weeks, then stop the medication. Meanwhile, continue Aimovig injections. Increase fiber in your diet, consider stool softner or miralax to help with constipation.

## 2019-11-21 NOTE — Progress Notes (Signed)
I have read the note, and I agree with the clinical assessment and plan.  Joy Taylor K [name]   

## 2019-11-23 ENCOUNTER — Ambulatory Visit: Admitting: Neurology

## 2019-11-28 ENCOUNTER — Encounter: Payer: Self-pay | Admitting: Family Medicine

## 2019-11-28 ENCOUNTER — Ambulatory Visit (INDEPENDENT_AMBULATORY_CARE_PROVIDER_SITE_OTHER): Payer: BC Managed Care – PPO

## 2019-11-28 ENCOUNTER — Ambulatory Visit (INDEPENDENT_AMBULATORY_CARE_PROVIDER_SITE_OTHER): Payer: BC Managed Care – PPO | Admitting: Family Medicine

## 2019-11-28 ENCOUNTER — Other Ambulatory Visit: Payer: Self-pay

## 2019-11-28 VITALS — BP 128/80 | HR 85 | Temp 96.0°F | Resp 12 | Ht 65.0 in | Wt 171.0 lb

## 2019-11-28 DIAGNOSIS — H9201 Otalgia, right ear: Secondary | ICD-10-CM

## 2019-11-28 DIAGNOSIS — H6981 Other specified disorders of Eustachian tube, right ear: Secondary | ICD-10-CM

## 2019-11-28 DIAGNOSIS — M542 Cervicalgia: Secondary | ICD-10-CM | POA: Diagnosis not present

## 2019-11-28 DIAGNOSIS — M329 Systemic lupus erythematosus, unspecified: Secondary | ICD-10-CM

## 2019-11-28 DIAGNOSIS — B354 Tinea corporis: Secondary | ICD-10-CM | POA: Diagnosis not present

## 2019-11-28 DIAGNOSIS — Z113 Encounter for screening for infections with a predominantly sexual mode of transmission: Secondary | ICD-10-CM

## 2019-11-28 DIAGNOSIS — R21 Rash and other nonspecific skin eruption: Secondary | ICD-10-CM

## 2019-11-28 MED ORDER — TIZANIDINE HCL 4 MG PO TABS: 4.0000 mg | ORAL_TABLET | Freq: Three times a day (TID) | ORAL | 1 refills | Status: AC | PRN

## 2019-11-28 MED ORDER — TERBINAFINE HCL 250 MG PO TABS: 250.0000 mg | ORAL_TABLET | Freq: Every day | ORAL | 0 refills | Status: AC

## 2019-11-28 NOTE — Patient Instructions (Addendum)
A few things to remember from today's visit:   Earache on right - Plan: Ambulatory referral to ENT  Eustachian tube dysfunction, right  Tinea corporis  Neck pain - Plan: Ambulatory referral to Physical Therapy  Skin rash - Plan: Ambulatory referral to Dermatology  Selsun blue also may help with rash.  Eustachian Tube Dysfunction  Eustachian tube dysfunction refers to a condition in which a blockage develops in the narrow passage that connects the middle ear to the back of the nose (eustachian tube). The eustachian tube regulates air pressure in the middle ear by letting air move between the ear and nose. It also helps to drain fluid from the middle ear space. Eustachian tube dysfunction can affect one or both ears. When the eustachian tube does not function properly, air pressure, fluid, or both can build up in the middle ear. What are the causes? This condition occurs when the eustachian tube becomes blocked or cannot open normally. Common causes of this condition include:  Ear infections.  Colds and other infections that affect the nose, mouth, and throat (upper respiratory tract).  Allergies.  Irritation from cigarette smoke.  Irritation from stomach acid coming up into the esophagus (gastroesophageal reflux). The esophagus is the tube that carries food from the mouth to the stomach.  Sudden changes in air pressure, such as from descending in an airplane or scuba diving.  Abnormal growths in the nose or throat, such as: ? Growths that line the nose (nasal polyps). ? Abnormal growth of cells (tumors). ? Enlarged tissue at the back of the throat (adenoids). What increases the risk? You are more likely to develop this condition if:  You smoke.  You are overweight.  You are a child who has: ? Certain birth defects of the mouth, such as cleft palate. ? Large tonsils or adenoids. What are the signs or symptoms? Common symptoms of this condition include:  A feeling of  fullness in the ear.  Ear pain.  Clicking or popping noises in the ear.  Ringing in the ear.  Hearing loss.  Loss of balance.  Dizziness. Symptoms may get worse when the air pressure around you changes, such as when you travel to an area of high elevation, fly on an airplane, or go scuba diving. How is this diagnosed? This condition may be diagnosed based on:  Your symptoms.  A physical exam of your ears, nose, and throat.  Tests, such as those that measure: ? The movement of your eardrum (tympanogram). ? Your hearing (audiometry). How is this treated? Treatment depends on the cause and severity of your condition.  In mild cases, you may relieve your symptoms by moving air into your ears. This is called "popping the ears."  In more severe cases, or if you have symptoms of fluid in your ears, treatment may include: ? Medicines to relieve congestion (decongestants). ? Medicines that treat allergies (antihistamines). ? Nasal sprays or ear drops that contain medicines that reduce swelling (steroids). ? A procedure to drain the fluid in your eardrum (myringotomy). In this procedure, a small tube is placed in the eardrum to:  Drain the fluid.  Restore the air in the middle ear space. ? A procedure to insert a balloon device through the nose to inflate the opening of the eustachian tube (balloon dilation). Follow these instructions at home: Lifestyle  Do not do any of the following until your health care provider approves: ? Travel to high altitudes. ? Fly in airplanes. ? Work in a  pressurized cabin or room. ? Scuba dive.  Do not use any products that contain nicotine or tobacco, such as cigarettes and e-cigarettes. If you need help quitting, ask your health care provider.  Keep your ears dry. Wear fitted earplugs during showering and bathing. Dry your ears completely after. General instructions  Take over-the-counter and prescription medicines only as told by your  health care provider.  Use techniques to help pop your ears as recommended by your health care provider. These may include: ? Chewing gum. ? Yawning. ? Frequent, forceful swallowing. ? Closing your mouth, holding your nose closed, and gently blowing as if you are trying to blow air out of your nose.  Keep all follow-up visits as told by your health care provider. This is important. Contact a health care provider if:  Your symptoms do not go away after treatment.  Your symptoms come back after treatment.  You are unable to pop your ears.  You have: ? A fever. ? Pain in your ear. ? Pain in your head or neck. ? Fluid draining from your ear.  Your hearing suddenly changes.  You become very dizzy.  You lose your balance. Summary  Eustachian tube dysfunction refers to a condition in which a blockage develops in the eustachian tube.  It can be caused by ear infections, allergies, inhaled irritants, or abnormal growths in the nose or throat.  Symptoms include ear pain, hearing loss, or ringing in the ears.  Mild cases are treated with maneuvers to unblock the ears, such as yawning or ear popping.  Severe cases are treated with medicines. Surgery may also be done (rare). This information is not intended to replace advice given to you by your health care provider. Make sure you discuss any questions you have with your health care provider. Document Revised: 02/22/2018 Document Reviewed: 02/22/2018 Elsevier Patient Education  [place].  Please be sure medication list is accurate. If a new problem present, please set up appointment sooner than planned today.
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
## 2019-11-29 LAB — HIV ANTIBODY (ROUTINE TESTING W REFLEX): HIV 1&2 Ab, 4th Generation: NONREACTIVE

## 2019-11-29 LAB — RPR: RPR Ser Ql: NONREACTIVE

## 2019-11-30 ENCOUNTER — Encounter: Payer: Self-pay | Admitting: Family Medicine

## 2019-12-06 ENCOUNTER — Telehealth: Payer: Self-pay | Admitting: Family Medicine

## 2019-12-06 DIAGNOSIS — M542 Cervicalgia: Secondary | ICD-10-CM

## 2019-12-06 NOTE — Telephone Encounter (Signed)
Patient states after neck xray if it came back normal, Dr. Martinique wanted her to request an MRI.    Also, patient is already in physical therapy for other things, but wants to start doing it for her neck.  She is requesting a referral for that.   Ph: [phone]

## 2019-12-11 ENCOUNTER — Other Ambulatory Visit: Payer: Self-pay | Admitting: Family Medicine

## 2019-12-11 DIAGNOSIS — S199XXS Unspecified injury of neck, sequela: Secondary | ICD-10-CM

## 2019-12-11 DIAGNOSIS — M542 Cervicalgia: Secondary | ICD-10-CM

## 2019-12-11 NOTE — Telephone Encounter (Signed)
Neck CT to evaluate soft tissue is more appropriate. Order placed. Thanks, BJ

## 2019-12-11 NOTE — Telephone Encounter (Signed)
I left pt a voicemail with the info below & let her know both orders have been placed and she will be called to schedule.

## 2019-12-20 ENCOUNTER — Other Ambulatory Visit: Payer: Self-pay

## 2019-12-20 ENCOUNTER — Ambulatory Visit (INDEPENDENT_AMBULATORY_CARE_PROVIDER_SITE_OTHER): Payer: BC Managed Care – PPO | Admitting: Otolaryngology

## 2019-12-20 VITALS — Temp 97.5°F

## 2019-12-20 DIAGNOSIS — H9203 Otalgia, bilateral: Secondary | ICD-10-CM

## 2019-12-20 NOTE — Progress Notes (Signed)
HPI: Joy Taylor is a 24 y.o. female who presents is referred by Dr.Jordan for evaluation of intermittent ear pain as well as fluctuating hearing problems.  Sometimes she feels like she cannot hear or understand people talking or even watching the TV.  She is also had intermittent ear pain.  However she has history of fibromyalgia as well as depression.  She is referred here by Dr. Martinique for evaluation of ear complaints..  Past Medical History:  Diagnosis Date  . Anxiety   . Depression   . Ehlers-Danlos disease   . Fibromyalgia   . Genital herpes    diagnosed by GYN-per patient no treatment advised unless daily flare ups occur  . GERD (gastroesophageal reflux disease)   . IBS (irritable bowel syndrome)   . Kidney stones   . Low BP    post-op  . Lupus ([place])   . Migraine   . Rheumatoid arthritis ([place])   . Stomach problems    Past Surgical History:  Procedure Laterality Date  . HIP ARTHROSCOPY Right 07/15/2016, 08/06/2015  . KNEE ARTHROSCOPY Right   . NISSEN FUNDOPLICATION  69/79/4801  . ROTATOR CUFF REPAIR Right 2010  . SHOULDER ARTHROSCOPY Right 06/29/2018   Social History   Socioeconomic History  . Marital status: Single    Spouse name: Not on file  . Number of children: 0  . Years of education: 29  . Highest education level: Not on file  Occupational History  . Occupation: Optometrist  Tobacco Use  . Smoking status: Never Smoker  . Smokeless tobacco: Never Used  Substance and Sexual Activity  . Alcohol use: Yes    Comment: rare-occ. per pt  . Drug use: No  . Sexual activity: Yes  Other Topics Concern  . 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 Reports this triggered all of her health problems.   Social Determinants of Health   Financial Resource Strain:   . Difficulty  of Paying Living Expenses: Not on file  Food Insecurity:   . Worried About Charity fundraiser in the Last Year: Not on file  . Ran Out of Food in the Last Year: Not on file  Transportation Needs:   . Lack of Transportation (Medical): Not on file  . Lack of Transportation (Non-Medical): Not on file  Physical Activity:   . Days of Exercise per Week: Not on file  . Minutes of Exercise per Session: Not on file  Stress:   . Feeling of Stress : Not on file  Social Connections:   . Frequency of Communication with Friends and Family: Not on file  . Frequency of Social Gatherings with Friends and Family: Not on file  . Attends Religious Services: Not on file  . Active Member of Clubs or Organizations: Not on file  . Attends Archivist Meetings: Not on file  . Marital Status: Not on file   Family History  Problem Relation Age of Onset  . Diabetes Mother   . Diverticulitis Mother   . Colon polyps Mother   . Macular degeneration Maternal Grandmother   . Heart disease Maternal Grandmother   . Hypertension Maternal Grandfather   . Heart disease Maternal Grandfather   . Bone cancer  Father   . Asthma Other   . Obesity Other   . Heart disease Other   . Colon cancer Neg Hx   . Esophageal cancer Neg Hx   . Pancreatic cancer Neg Hx   . Stomach cancer Neg Hx   . Liver disease Neg Hx   . Rectal cancer Neg Hx    No Known Allergies Prior to Admission medications   Medication Sig Start Date End Date Taking? Authorizing Provider  ALOE VERA JUICE PO Take by mouth daily. 1 tablespoon. May take up to 3 tablespoons if bad   Yes [provider]  aspirin-acetaminophen-caffeine (EXCEDRIN MIGRAINE) [phone] MG tablet Take 2 tablets by mouth every 6 (six) hours as needed for headache.    Yes [provider]  busPIRone (BUSPAR) 15 MG tablet Take 1 tablet (15 mg total) by mouth at bedtime. 04/12/19  Yes Armbruster, Carlota Raspberry, MD  Certolizumab Pegol (CIMZIA PREFILLED) 2 X 200  MG/ML KIT Inject 400 mg into the skin every 30 (thirty) days. Last shot given 04/28/2019   Yes [provider]  Erenumab-aooe (AIMOVIG) 70 MG/ML SOAJ Inject 70 mg into the skin every 30 (thirty) days. 11/20/19  Yes Suzzanne Cloud, NP  escitalopram (LEXAPRO) 10 MG tablet Take 10 mg by mouth daily.   Yes [provider]  fluticasone (FLONASE) 50 MCG/ACT nasal spray Place 2 sprays into both nostrils as needed (tries to take every other day).  03/03/19  Yes [provider]  FML FORTE 0.25 % ophthalmic suspension  11/24/18  Yes [provider]  gabapentin (NEURONTIN) 600 MG tablet Take 1 tablet (600 mg total) by mouth 3 (three) times daily. 08/16/19  Yes Suzzanne Cloud, NP  glucose monitoring kit (FREESTYLE) monitoring kit 1 each by Does not apply route as needed for other. 01/25/19  Yes Martinique, Betty G, MD  hyoscyamine (LEVSIN SL) 0.125 MG SL tablet Place 1 tablet (0.125 mg total) under the tongue every 6 (six) hours as needed. As needed for abdominal pain, cramping/spasm 05/01/19  Yes Esterwood, Amy S, PA-C  JUNEL 1.5/30 1.5-30 MG-MCG tablet TAKE 1 TABLET BY MOUTH EVERY DAY 12/14/18  Yes Colin Benton R, DO  MELATONIN PO Take 2 tablets by mouth as needed.   Yes [provider]  Multiple Vitamins-Minerals (MULTIVITAMIN WOMEN PO) Take 1 tablet by mouth daily.   Yes [provider]  ondansetron (ZOFRAN ODT) 4 MG disintegrating tablet Take 1 tablet (4 mg total) by mouth every 6 (six) hours as needed for nausea or vomiting. 12/06/18  Yes Armbruster, Carlota Raspberry, MD  pilocarpine (SALAGEN) 5 MG tablet Take 5 mg by mouth 4 (four) times daily as needed (for dry mouth).   Yes [provider]  REFRESH CELLUVISC 1 % GEL 1 drop 2 (two) times daily. 04/24/19  Yes [provider]  SYSTANE BALANCE 0.6 % SOLN  08/25/18  Yes [provider]  tiZANidine (ZANAFLEX) 4 MG tablet Take 1 tablet (4 mg total) by mouth every 8 (eight) hours as needed for muscle spasms.  11/28/19  Yes Martinique, Betty G, MD     Positive ROS: Otherwise negative  All other systems have been reviewed and were otherwise negative with the exception of those mentioned in the HPI and as above.  Physical Exam: Constitutional: Alert, well-appearing, no acute distress Ears: External ears without lesions or tenderness. Ear canals are clear bilaterally with intact, clear TMs bilaterally with good mobility pneumatic otoscopy.  Hearing screening with a [phone] tuning  fork revealed fairly good hearing in both ears with AC > BC bilaterally. Nasal: External nose without lesions. Septum with minimal deformity.  Mild rhinitis.  Both middle meatus regions were clear.  No polyps noted.  No signs of infection..  Oral: Lips and gums without lesions. Tongue and palate mucosa without lesions. Posterior oropharynx clear. Neck: No palpable adenopathy or masses.  No palpable TMJ problems noted.  No pain or discomfort on wiggling the ears in the office today. Respiratory: Breathing comfortably  Skin: No facial/neck lesions or rash noted.  Audiogram demonstrated symmetric normal hearing in both ears with SRT's of 5dB bilaterally.  Procedures  Assessment: Normal ear, external ear and TM evaluation bilaterally.  Questionable etiology of otalgia. Normal audiologic testing.  Plan: Reviewed with Joia today concerning normal evaluation.  Questionable etiology of ear pain and hearing problems but this may be related to her fibromyalgia. I do not recommend any specific therapy.   Radene Journey, MD   CC: Betty Martinique, MD.

## 2019-12-21 ENCOUNTER — Encounter (INDEPENDENT_AMBULATORY_CARE_PROVIDER_SITE_OTHER): Payer: Self-pay

## 2020-01-09 ENCOUNTER — Ambulatory Visit
Admission: RE | Admit: 2020-01-09 | Discharge: 2020-01-09 | Disposition: A | Discharge status: Home / Self Care | Payer: BC Managed Care – PPO | Source: Ambulatory Visit | Attending: Family Medicine | Admitting: Family Medicine

## 2020-01-09 ENCOUNTER — Other Ambulatory Visit: Payer: Self-pay

## 2020-01-09 DIAGNOSIS — M542 Cervicalgia: Secondary | ICD-10-CM

## 2020-01-09 DIAGNOSIS — S199XXS Unspecified injury of neck, sequela: Secondary | ICD-10-CM

## 2020-01-09 MED ORDER — IOPAMIDOL (ISOVUE-300) INJECTION 61%
75.0000 mL | Freq: Once | INTRAVENOUS | Status: AC | PRN
  Administered 2020-01-09: 75 mL via INTRAVENOUS

## 2020-01-11 ENCOUNTER — Telehealth: Payer: Self-pay | Admitting: Family Medicine

## 2020-01-11 ENCOUNTER — Encounter: Payer: Self-pay | Admitting: Family Medicine

## 2020-01-11 ENCOUNTER — Other Ambulatory Visit: Payer: Self-pay | Admitting: Family Medicine

## 2020-01-11 DIAGNOSIS — E041 Nontoxic single thyroid nodule: Secondary | ICD-10-CM

## 2020-01-11 NOTE — Telephone Encounter (Signed)
Pt would like to speak to you regarding her CT scan results. Pt will not be available to speak to you until 11 a.m. or after.  Thanks

## 2020-01-12 NOTE — Telephone Encounter (Signed)
I called and spoke with pt. She saw mychart message from pcp. She is aware they will call her to schedule the ultrasound.

## 2020-01-15 ENCOUNTER — Other Ambulatory Visit: Payer: Self-pay | Admitting: Family Medicine

## 2020-01-19 ENCOUNTER — Ambulatory Visit
Admission: RE | Admit: 2020-01-19 | Discharge: 2020-01-19 | Disposition: A | Discharge status: Home / Self Care | Payer: BC Managed Care – PPO | Source: Ambulatory Visit | Attending: Family Medicine | Admitting: Family Medicine

## 2020-01-19 DIAGNOSIS — E041 Nontoxic single thyroid nodule: Secondary | ICD-10-CM

## 2020-01-21 ENCOUNTER — Encounter: Payer: Self-pay | Admitting: Family Medicine

## 2020-01-26 ENCOUNTER — Telehealth: Payer: Self-pay | Admitting: Family Medicine

## 2020-01-26 DIAGNOSIS — Z808 Family history of malignant neoplasm of other organs or systems: Secondary | ICD-10-CM

## 2020-01-26 NOTE — Telephone Encounter (Signed)
Pt has questions about her Thyroid results that she received. Pt stated that her grandma and uncle died of Thyroid cancer so she would like to be referred to an endocrinologist and would like a fine needle biopsy performed?   Pt can be reached at [phone]

## 2020-01-29 NOTE — Addendum Note (Signed)
Addended by: Rodrigo Ran on: 01/29/2020 10:54 AM   Modules accepted: Orders

## 2020-01-29 NOTE — Telephone Encounter (Signed)
It is ok to place referral as requested. Thanks, BJ 

## 2020-01-29 NOTE — Telephone Encounter (Signed)
I called and spoke with patient. Referral has been placed and she is aware they will contact her to schedule. Advised her to let me know if she hasn't heard from them by mid next week.

## 2020-02-12 IMAGING — CT CT ABDOMEN AND PELVIS WITH CONTRAST
2 of 4 series · 16 of 46 positions shown, 18 images · IV contrast (APPLIED)
Comparison: Gastric emptying study 04/14/2018

CLINICAL DATA: Patient status post this lymphoma with indication in
1869 for chronic dyspepsia. Periodically mild abdominal discomfort.
Bloating. Underlying constipation.

EXAM:
CT ABDOMEN AND PELVIS WITH CONTRAST
TECHNIQUE: Multidetector CT imaging of the abdomen and pelvis was performed
using the standard protocol following bolus administration of
intravenous contrast.
CONTRAST:  100mL OMNIPAQUE IOHEXOL 300 MG/ML  SOLN

[Series 2: axial st · axial · 0.88mm/px · z∈[+1022,+1452]mm · 13 of 96 slices shown, 15 images]
[im 5/96  soft-tissue]
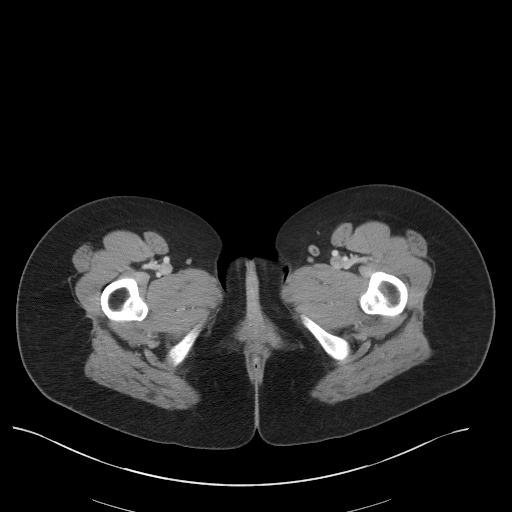
[im 5/96  bone]
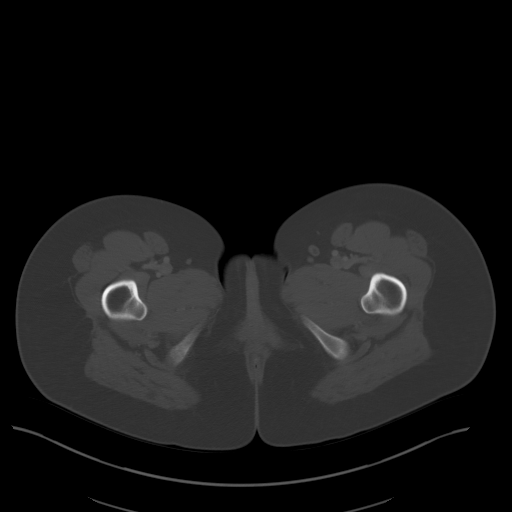
[im 14/96  soft-tissue]
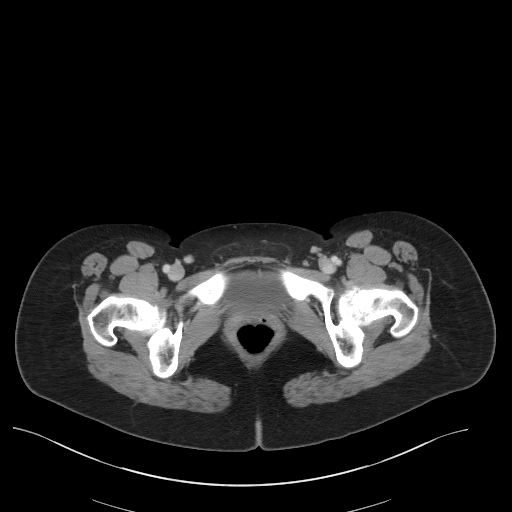
[im 19/96  soft-tissue]
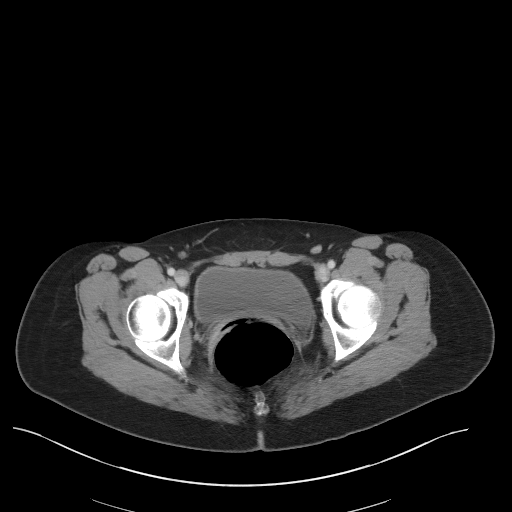
[im 28/96  soft-tissue]
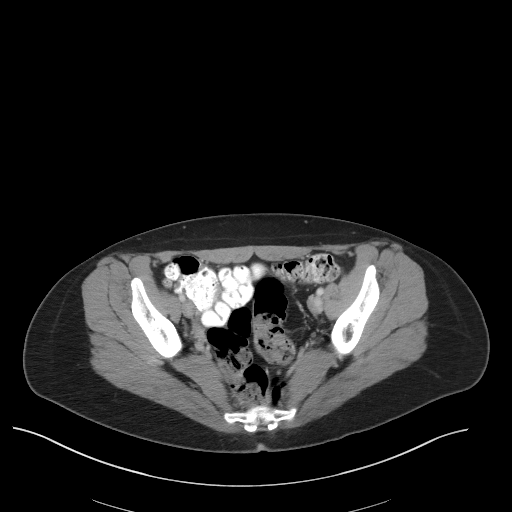
[im 32/96  soft-tissue]
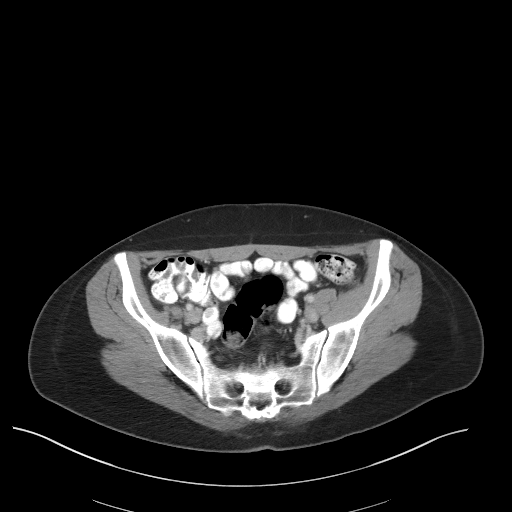
[im 41/96  soft-tissue]
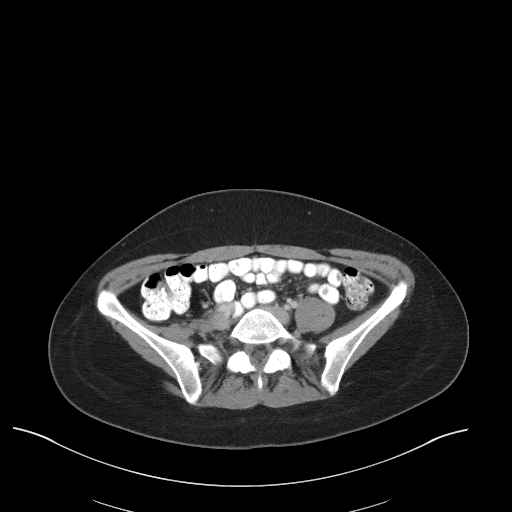
[im 50/96  soft-tissue]
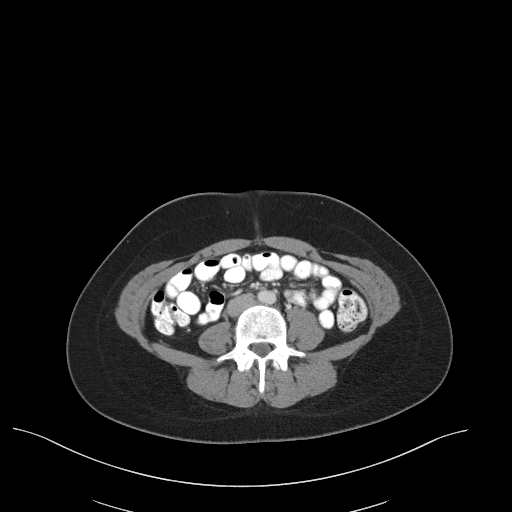
[im 55/96  soft-tissue]
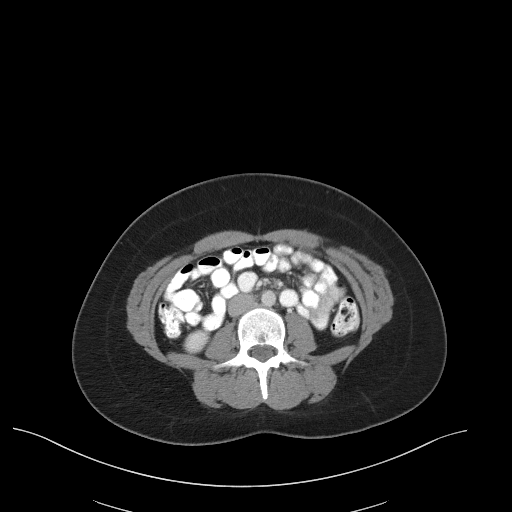
[im 64/96  soft-tissue]
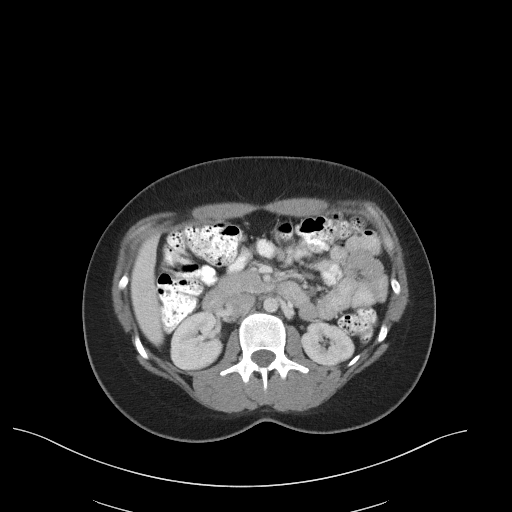
[im 64/96  bone]
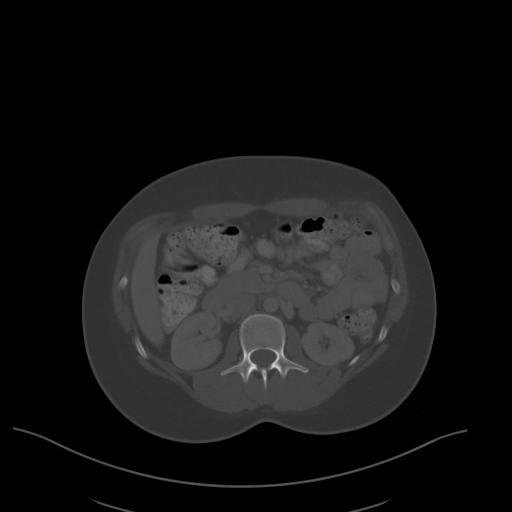
[im 68/96  soft-tissue]
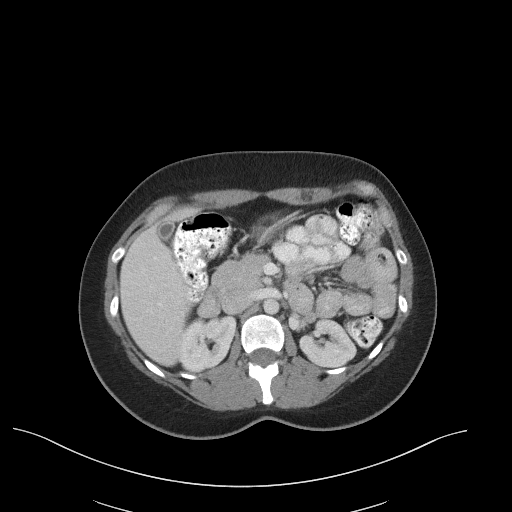
[im 77/96  soft-tissue]
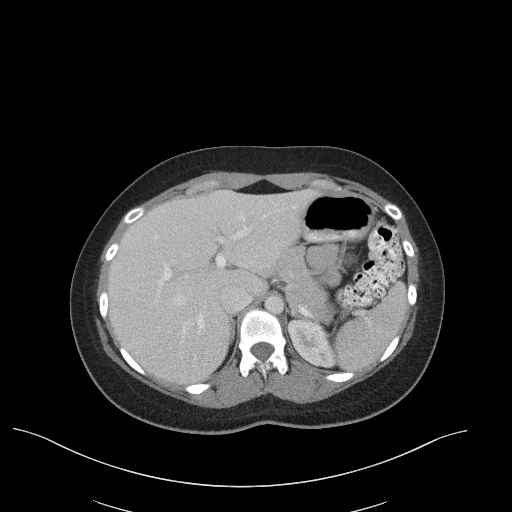
[im 82/96  soft-tissue]
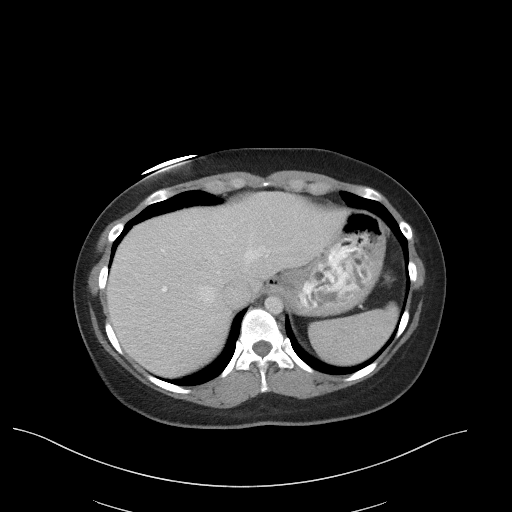
[im 91/96  soft-tissue]
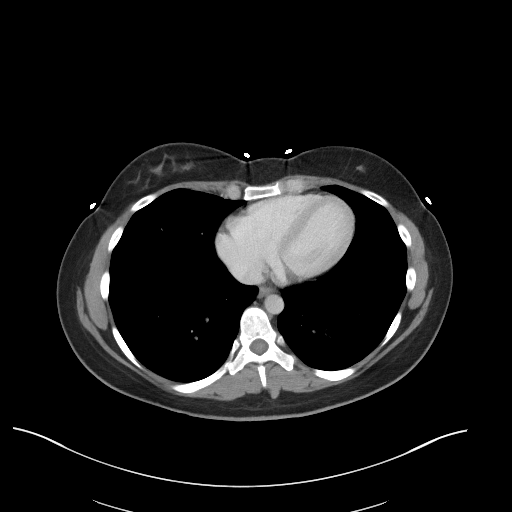

[Series 5: coronal st · coronal · 0.73mm/px · 3 of 91 slices shown]
[im 31/91  soft-tissue]
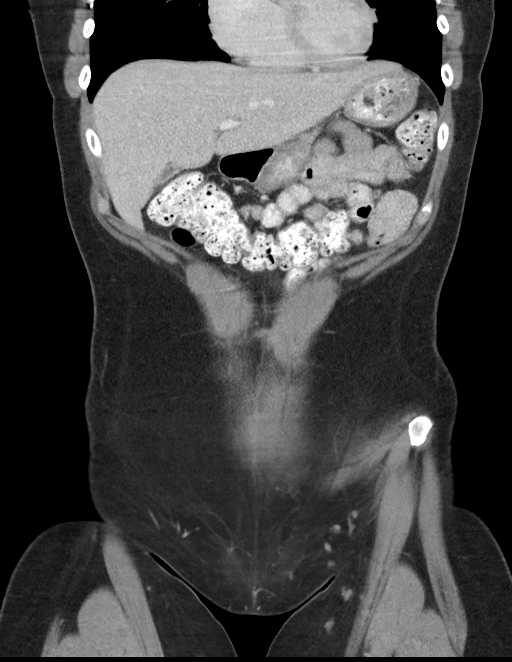
[im 41/91  soft-tissue]
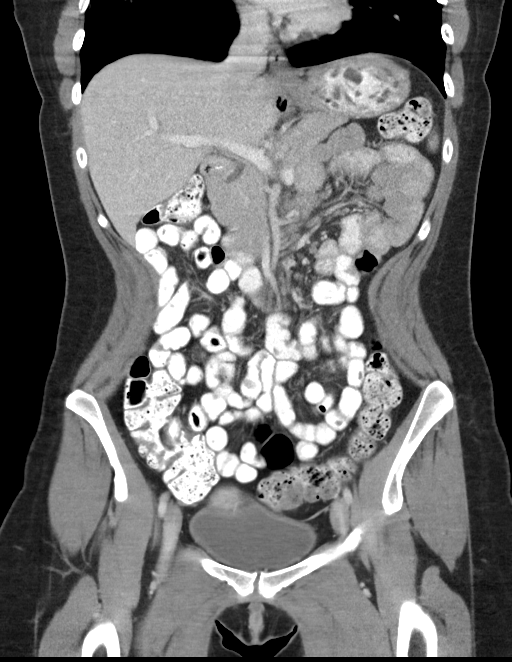
[im 51/91  soft-tissue]
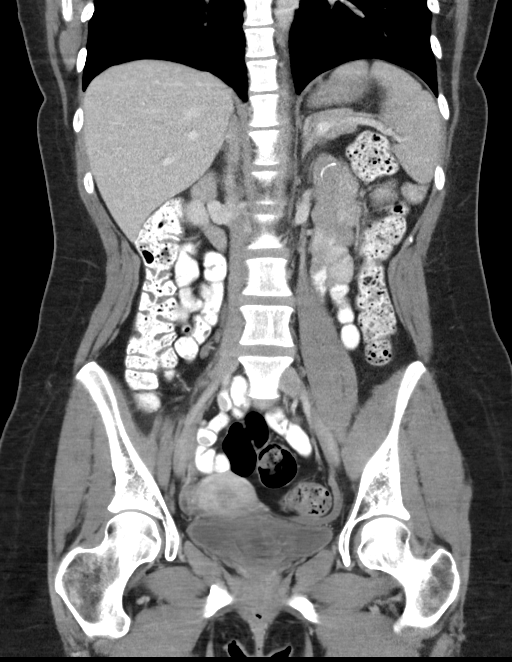

[16 of 46 positions shown; findings below may reference images not displayed]

FINDINGS: Lower chest: Lung bases are clear.

Hepatobiliary: No focal hepatic lesion. No biliary duct dilatation.
Gallbladder is normal. Common bile duct is normal.

Pancreas: Pancreas is normal. No ductal dilatation. No pancreatic
inflammation.

Spleen: Normal spleen

Adrenals/urinary tract: Adrenal glands and kidneys are normal. The
ureters and bladder normal.

Stomach/Bowel: Distal esophagus and stomach normal. The
fundoplication is subtly evident through the GE junction. No
complicating features. Distal esophagus and duodenum are normal. The
small bowel normal. Appendix normal. Colon rectosigmoid colon
normal. Moderate volume stool throughout the colon.

Vascular/Lymphatic: Abdominal aorta is normal caliber. No periportal
or retroperitoneal adenopathy. No pelvic adenopathy.

Reproductive: Uterus and ovaries normal.

Other: No free fluid.

Musculoskeletal: No aggressive osseous lesion.
IMPRESSION: 1. No acute findings in the abdomen pelvis.
2. Fundoplication wrap noted without evidence complication.
3. Moderate volume stool throughout the colon.

## 2020-02-14 ENCOUNTER — Other Ambulatory Visit: Payer: Self-pay

## 2020-02-19 ENCOUNTER — Other Ambulatory Visit: Payer: Self-pay

## 2020-02-19 ENCOUNTER — Encounter: Payer: Self-pay | Admitting: Endocrinology

## 2020-02-19 ENCOUNTER — Ambulatory Visit (INDEPENDENT_AMBULATORY_CARE_PROVIDER_SITE_OTHER): Payer: BC Managed Care – PPO | Admitting: Endocrinology

## 2020-02-19 DIAGNOSIS — E162 Hypoglycemia, unspecified: Secondary | ICD-10-CM | POA: Diagnosis not present

## 2020-02-19 DIAGNOSIS — R251 Tremor, unspecified: Secondary | ICD-10-CM

## 2020-02-19 DIAGNOSIS — E041 Nontoxic single thyroid nodule: Secondary | ICD-10-CM

## 2020-02-19 NOTE — Patient Instructions (Addendum)
Fasting blood tests are requested for you today.  We'll let you know about the results. Please call ahead, so the lab can expect you. Let's check biopsy, guided by the ultrasound.  you will receive a phone call, about a day and time for an appointment. Here is a blood sugar meter.  Please check if you get the symptoms you mentioned today.  Please call if it is below 50.

## 2020-02-19 NOTE — Progress Notes (Signed)
Subjective:    Patient ID: Joy Taylor, female    DOB: 1996/08/24, 24 y.o.   MRN: [id number]  HPI Pt is referred by Dr Martinique, for nodular thyroid.  Pt was incidentally noted to have a thyroid nodule in early 2021.   she is unaware of ever having had thyroid problems in the past.  she has no h/o XRT or surgery to the neck.  She has a slight cough.  She had Nissen fundoplication in 1245.  She has intermitt sxs of hypoglycemia (fatigue, diaph, tremor), approx 4-5 hrs after eating, but not in the middle of the night.  Eating or drinking resolves sxs.  She requests thyroid bx.   Past Medical History:  Diagnosis Date  . Anxiety   . Depression   . Ehlers-Danlos disease   . Fibromyalgia   . Genital herpes    diagnosed by GYN-per patient no treatment advised unless daily flare ups occur  . GERD (gastroesophageal reflux disease)   . IBS (irritable bowel syndrome)   . Kidney stones   . Low BP    post-op  . Lupus ([place])   . Migraine   . Rheumatoid arthritis ([place])   . Stomach problems     Past Surgical History:  Procedure Laterality Date  . HIP ARTHROSCOPY Right 07/15/2016, 08/06/2015  . KNEE ARTHROSCOPY Right   . NISSEN FUNDOPLICATION  80/99/8338  . ROTATOR CUFF REPAIR Right 2010  . SHOULDER ARTHROSCOPY Right 06/29/2018    Social History   Socioeconomic History  . Marital status: Single    Spouse name: Not on file  . Number of children: 0  . Years of education: 91  . Highest education level: Not on file  Occupational History  . Occupation: Optometrist  Tobacco Use  . Smoking status: Never Smoker  . Smokeless tobacco: Never Used  Substance and Sexual Activity  . Alcohol use: Yes    Comment: rare-occ. per pt  . Drug use: No  . Sexual activity: Yes  Other Topics Concern  . 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 Reports this triggered all of her health problems.   Social Determinants of Health   Financial Resource Strain:   . Difficulty of Paying Living Expenses:   Food Insecurity:   . Worried About Charity fundraiser in the Last Year:   . Arboriculturist in the Last Year:   Transportation Needs:   . Film/video editor (Medical):   Marland Kitchen Lack of Transportation (Non-Medical):   Physical Activity:   . Days of Exercise per Week:   . Minutes of Exercise per Session:   Stress:   . Feeling of Stress :   Social Connections:   . Frequency of Communication with Friends and Family:   . Frequency of Social Gatherings with Friends and Family:   . Attends Religious Services:   . Active Member of Clubs or Organizations:   . Attends Archivist Meetings:   Marland Kitchen Marital Status:   Intimate Partner Violence:   . Fear of Current or Ex-Partner:   . Emotionally Abused:   Marland Kitchen Physically Abused:   . Sexually Abused:     Current Outpatient Medications on File Prior to Visit  Medication Sig Dispense Refill  . aspirin-acetaminophen-caffeine (EXCEDRIN MIGRAINE) 250-250-65 MG tablet Take 2 tablets by mouth every 6 (six) hours as needed for headache.     . busPIRone (BUSPAR) 15 MG tablet Take 1 tablet (15 mg total) by mouth at bedtime. 30 tablet 5  . Certolizumab Pegol (CIMZIA PREFILLED) 2 X 200 MG/ML KIT Inject 400 mg into the skin every 30 (thirty) days. Last shot given 04/28/2019    . Erenumab-aooe (AIMOVIG) 70 MG/ML SOAJ Inject 70 mg into the skin every 30 (thirty) days. 1 pen 6  . escitalopram (LEXAPRO) 10 MG tablet Take 10 mg by mouth daily.    . fluticasone (FLONASE) 50 MCG/ACT nasal spray Place 2 sprays into both nostrils as needed (tries to take every other day).     Marland Kitchen gabapentin (NEURONTIN) 600 MG tablet Take 1 tablet (600 mg total) by mouth 3 (three) times daily. 90 tablet 6  . glucose monitoring kit (FREESTYLE) monitoring kit 1 each by Does not apply route as  needed for other. 1 each 0  . hyoscyamine (LEVSIN SL) 0.125 MG SL tablet Place 1 tablet (0.125 mg total) under the tongue every 6 (six) hours as needed. As needed for abdominal pain, cramping/spasm 30 tablet 1  . JUNEL 1.5/30 1.5-30 MG-MCG tablet TAKE 1 TABLET BY MOUTH EVERY DAY 63 tablet 3  . MELATONIN PO Take 2 tablets by mouth as needed.    . Multiple Vitamins-Minerals (MULTIVITAMIN WOMEN PO) Take 1 tablet by mouth daily.    . ondansetron (ZOFRAN ODT) 4 MG disintegrating tablet Take 1 tablet (4 mg total) by mouth every 6 (six) hours as needed for nausea or vomiting. 30 tablet 3  . pilocarpine (SALAGEN) 5 MG tablet Take 5 mg by mouth 4 (four) times daily as needed (for dry mouth).    Carren Rang BALANCE 0.6 % SOLN daily.     Marland Kitchen tiZANidine (ZANAFLEX) 4 MG tablet Take 1 tablet (4 mg total) by mouth every 8 (eight) hours as needed for muscle spasms. 30 tablet 1  . mirtazapine (REMERON) 15 MG tablet Take 7.5 mg by mouth at bedtime.     No current facility-administered medications on file prior to visit.    No Known Allergies  Family History  Problem Relation Age of Onset  . Diabetes Mother   . Diverticulitis Mother   . Colon polyps Mother   . Macular degeneration Maternal Grandmother   . Heart disease Maternal Grandmother   . Hypertension Maternal Grandfather   . Heart disease Maternal Grandfather   . Bone cancer Father   . Asthma Other   . Obesity Other   . Heart disease Other   . Thyroid cancer Other        uncertain cell type.  several distant relatives--all deceased  . Colon cancer Neg Hx   . Esophageal cancer Neg Hx   . Pancreatic cancer Neg Hx   . Stomach cancer Neg Hx   . Liver disease Neg Hx   . Rectal cancer Neg Hx     BP 110/70   Pulse 92   Ht '5\' 5"'  (1.651 m)   Wt 182 lb 6.4 oz (82.7 kg)   SpO2 100%   BMI 30.35 kg/m    Review of Systems Denies hoarseness, sob, dysphagia, and diarrhea.  She has slight pain at the ant neck.  She has weight gain, cold intolerance,  and intermitt flushing sensation.      Objective:   Physical Exam VS: see vs page GEN:  no distress HEAD: head: no deformity eyes: no periorbital swelling, no proptosis external nose and ears are normal NECK: supple, thyroid is not enlarged.  I cannot appreciate the nodule.  CHEST WALL: no deformity LUNGS: clear to auscultation CV: reg rate and rhythm, no murmur.  MUSCULOSKELETAL: muscle bulk and strength are grossly normal.  no obvious joint swelling.  gait is normal and steady EXTEMITIES: no deformity.  No leg edema.   PULSES: no carotid bruit NEURO:  cn 2-12 grossly intact.   readily moves all 4's.  sensation is intact to touch on all 4's. SKIN:  Normal texture and temperature.  No rash or suspicious lesion is visible.   NODES:  None palpable at the neck PSYCH: alert, well-oriented.  Does not appear anxious nor depressed.    [place]: Solitary nodule within mid aspect of the right lobe of the thyroid, correlating with the nodule seen on preceding neck CT, meets imaging criteria to recommend a 1 year follow-up as clinically indicated.  CT: 10 mm right thyroid nodule. Recommend thyroid US based on young age.  Lab Results  Component Value Date   TSH 1.68 04/24/2019   I have reviewed outside records, and summarized: Pt was noted to have elevated thyroid nodule, and referred here.  Pt was advised she needed annual thyroid US, but she requested bx      Assessment & Plan:  Thyroid nodule, new to me.  Pt requests bx. Tremor, new, uncertain etiology.  FHx of thyroid cancer: check calcitonin  Patient Instructions  Fasting blood tests are requested for you today.  We'll let you know about the results. Please call ahead, so the lab can expect you. Let's check biopsy, guided by the ultrasound.  you will receive a phone call, about a day and time for an appointment. Here is a blood sugar meter.  Please check if you get the symptoms you mentioned today.  Please call if it is below 50.

## 2020-02-21 ENCOUNTER — Other Ambulatory Visit (INDEPENDENT_AMBULATORY_CARE_PROVIDER_SITE_OTHER): Payer: BC Managed Care – PPO

## 2020-02-21 ENCOUNTER — Other Ambulatory Visit: Payer: Self-pay | Admitting: Endocrinology

## 2020-02-21 ENCOUNTER — Ambulatory Visit
Admission: RE | Admit: 2020-02-21 | Discharge: 2020-02-21 | Disposition: A | Discharge status: Home / Self Care | Payer: BC Managed Care – PPO | Source: Ambulatory Visit | Attending: Endocrinology | Admitting: Endocrinology

## 2020-02-21 ENCOUNTER — Other Ambulatory Visit: Payer: Self-pay

## 2020-02-21 ENCOUNTER — Other Ambulatory Visit (HOSPITAL_COMMUNITY)
Admission: RE | Admit: 2020-02-21 | Discharge: 2020-02-21 | Disposition: A | Discharge status: Home / Self Care | Payer: BC Managed Care – PPO | Source: Ambulatory Visit | Attending: Endocrinology | Admitting: Endocrinology

## 2020-02-21 DIAGNOSIS — E041 Nontoxic single thyroid nodule: Secondary | ICD-10-CM | POA: Diagnosis not present

## 2020-02-21 DIAGNOSIS — R251 Tremor, unspecified: Secondary | ICD-10-CM

## 2020-02-21 DIAGNOSIS — C73 Malignant neoplasm of thyroid gland: Secondary | ICD-10-CM | POA: Insufficient documentation

## 2020-02-21 LAB — BASIC METABOLIC PANEL
BUN: 19 mg/dL (ref 6–23)
CO2: 26 mEq/L (ref 19–32)
Calcium: 9.5 mg/dL (ref 8.4–10.5)
Chloride: 103 mEq/L (ref 96–112)
Creatinine, Ser: 0.73 mg/dL (ref 0.40–1.20)
GFR: 96.58 mL/min (ref >60.00)
Glucose, Bld: 79 mg/dL (ref 70–99)
Potassium: 4.1 mEq/L (ref 3.5–5.1)
Sodium: 137 mEq/L (ref 135–145)

## 2020-02-21 LAB — TSH: TSH: 0.94 u[IU]/mL (ref 0.35–4.50)

## 2020-02-21 LAB — HEMOGLOBIN A1C: Hgb A1c MFr Bld: 5.2 % (ref 4.6–6.5)

## 2020-02-23 ENCOUNTER — Other Ambulatory Visit: Payer: Self-pay | Admitting: Endocrinology

## 2020-02-23 DIAGNOSIS — E041 Nontoxic single thyroid nodule: Secondary | ICD-10-CM

## 2020-02-23 LAB — CYTOLOGY - NON PAP

## 2020-02-24 LAB — INSULIN, RANDOM: Insulin: 10.4 u[IU]/mL

## 2020-02-24 LAB — THYROID PEROXIDASE ANTIBODY: Thyroperoxidase Ab SerPl-aCnc: 1 IU/mL (ref <9)

## 2020-02-24 LAB — METANEPHRINES, PLASMA
Metanephrine, Free: <25 pg/mL (ref <=57)
Normetanephrine, Free: 57 pg/mL (ref <=148)
Total Metanephrines-Plasma: 57 pg/mL (ref <=205)

## 2020-02-24 LAB — CALCITONIN: Calcitonin: <2 pg/mL (ref <=5)

## 2020-02-26 ENCOUNTER — Telehealth: Payer: Self-pay | Admitting: Endocrinology

## 2020-02-26 ENCOUNTER — Other Ambulatory Visit: Payer: Self-pay

## 2020-02-26 NOTE — Telephone Encounter (Signed)
Patient called an would like a call back regarding her resent biopsy  Call back number is [phone]

## 2020-02-26 NOTE — Telephone Encounter (Signed)
Patient notified by my chart and is coming in tomorrow to talk with Dr Loanne Drilling.

## 2020-02-27 ENCOUNTER — Ambulatory Visit (INDEPENDENT_AMBULATORY_CARE_PROVIDER_SITE_OTHER): Payer: BC Managed Care – PPO | Admitting: Endocrinology

## 2020-02-27 DIAGNOSIS — E041 Nontoxic single thyroid nodule: Secondary | ICD-10-CM | POA: Diagnosis not present

## 2020-02-27 NOTE — Progress Notes (Signed)
   Subjective:    Patient ID: Donnita Falls, female    DOB: 21-Dec-1995, 24 y.o.   MRN: [id number]  HPI  telehealth visit today via phone x 19 minutes Alternatives to telehealth are presented to this patient, and the patient agrees to the telehealth visit.   Pt is advised of the cost of the visit, and agrees to this, also.   Patient is at home, and I am at home.   Persons attending the telehealth visit: the patient and I Pt had thyroid bx, which was Beth cat 5. She requests to see Dr Kathi Simpers at [facility].  sxs are unchanged.     Review of Systems     Objective:   Physical Exam   We discussed lab results.     Assessment & Plan:  Weight gain, and other symptoms. No cause is found. Thyroid nodule: small, but high risk cytol.  We discussed likely rx of a small focus of PTC.   Patient Instructions  No cause for your symptoms is found.    I did referral to Dr Kathi Simpers

## 2020-02-27 NOTE — Patient Instructions (Signed)
No cause for your symptoms is found.

## 2020-03-08 LAB — CATECHOLAMINES, FRACTIONATED, PLASMA
Dopamine: 20 pg/mL — ABNORMAL HIGH
Epinephrine: <20 pg/mL
Norepinephrine: 290 pg/mL
Total Catecholamines: 310 pg/mL

## 2020-03-12 ENCOUNTER — Telehealth: Payer: Self-pay

## 2020-03-12 NOTE — Telephone Encounter (Signed)
Received request from [facility] for PA on aimovig. Initiated PA on covermymeds. Key: [id number]. Sent to [facility]. Should have a determination in 3-5 business days.

## 2020-03-18 NOTE — Telephone Encounter (Signed)
Received notice of approval from [facility] for [place] from 03/12/20-03/12/2021.

## 2020-03-18 NOTE — Telephone Encounter (Signed)
[facility] notified.

## 2020-03-20 ENCOUNTER — Ambulatory Visit: Admitting: Neurology

## 2020-03-20 HISTORY — PX: THYROIDECTOMY, PARTIAL: SHX18

## 2020-04-02 ENCOUNTER — Encounter: Payer: Self-pay | Admitting: Neurology

## 2020-04-02 ENCOUNTER — Telehealth (INDEPENDENT_AMBULATORY_CARE_PROVIDER_SITE_OTHER): Payer: BC Managed Care – PPO | Admitting: Neurology

## 2020-04-02 DIAGNOSIS — M797 Fibromyalgia: Secondary | ICD-10-CM | POA: Diagnosis not present

## 2020-04-02 DIAGNOSIS — G43809 Other migraine, not intractable, without status migrainosus: Secondary | ICD-10-CM | POA: Diagnosis not present

## 2020-04-02 MED ORDER — AIMOVIG 70 MG/ML ~~LOC~~ SOAJ: 70.0000 mg | SUBCUTANEOUS | 11 refills | Status: DC

## 2020-04-02 MED ORDER — GABAPENTIN 600 MG PO TABS: 600.0000 mg | ORAL_TABLET | Freq: Three times a day (TID) | ORAL | 6 refills | Status: DC

## 2020-04-02 NOTE — Progress Notes (Signed)
    Virtual Visit via Video Note  I connected with Ellyanna Oros on 04/02/20 at  2:45 PM EDT by a video enabled telemedicine application and verified that I am speaking with the correct person using two identifiers.  Location: Patient: at her home Provider: in the office   I discussed the limitations of evaluation and management by telemedicine and the availability of in person appointments. The patient expressed understanding and agreed to proceed.  History of Present Illness: 04/02/2020 SS: Ms. Hermreck 24 year old female with history of fibromyalgia and migraine headaches.  She also has history of rheumatoid arthritis and lupus.  She is now off Depakote.  She is on Aimovig 70 mg.  She is on gabapentin for fibromyalgia.  Her headaches are 80-85% better, only gets them now around her menstrual cycle, are less intense.  She will take Excedrin Migraine.  Is tolerating Aimovig, does have some constipation, but manages with stool softener.  She recently was diagnosed with thyroid cancer, had a hemithyroidectomy.  For fibromyalgia, she has previously tried Cymbalta and nortriptyline without benefit.  She says she has previously been to the pain clinic, but they released her.  She has recently graduated from college.  She presents today for evaluation via virtual visit.  11/20/2019 SS: Ms. Marley is a 24 year old female with history of fibromyalgia and migraine headaches.  She also has history of rheumatoid arthritis and lupus.  MRI of the brain was normal.  She remains on Depakote, gabapentin, and Aimovig.  She has had good benefit with Aimovig, reports a 75% reduction in her headaches.  She says she now only has headaches around her menstrual cycle.  She does report a side effect of feeling that her brain is shivering if she is late on taking her Lexapro.  She does report constipation with Aimovig, but is not interested in switching medications.  She reports for her headaches, she will take Excedrin Migraine, or  use essential oils or herbal tea.  For fibromyalgia, she remains on gabapentin, she has tried Cymbalta and nortriptyline previously without benefit.  She presents today for follow-up via telephone, she was unable to login for my chart visit.   Observations/Objective: Via virtual visit, is alert and oriented, speech is clear and concise, facial symmetry noted, no arm drift, gait appears intact  Assessment and Plan: 1.  Migraine headache 2.  Fibromyalgia  She will remain on Aimovig, she has had excellent benefit with her migraine headaches.  She will remain on gabapentin for fibromyalgia. She is now completely off Depakote. Refills sent for Aimovig and gabapentin.  Follow-up in 6 months or sooner if needed.  Follow Up Instructions: 6 months 10/09/20 3:45   I discussed the assessment and treatment plan with the patient. The patient was provided an opportunity to ask questions and all were answered. The patient agreed with the plan and demonstrated an understanding of the instructions.   The patient was advised to call back or seek an in-person evaluation if the symptoms worsen or if the condition fails to improve as anticipated.  I spent 20 minutes of face-to-face and non-face-to-face time with patient.  This included previsit chart review, lab review, study review, order entry, electronic health record documentation, patient education.   Evangeline Dakin, DNP  [facility] [address] [place], [place] [address] [phone]

## 2020-04-22 ENCOUNTER — Telehealth: Payer: Self-pay | Admitting: Endocrinology

## 2020-04-22 DIAGNOSIS — E041 Nontoxic single thyroid nodule: Secondary | ICD-10-CM

## 2020-04-22 NOTE — Telephone Encounter (Signed)
Patient called stating she's been having symptoms of racing heart, sweating and irritability, brain fog and asked that the nurse give her a call. [phone]

## 2020-04-23 NOTE — Telephone Encounter (Signed)
Pt was called and given MD message. She is scheduled for blood work tomorrow at [facility].

## 2020-04-23 NOTE — Telephone Encounter (Signed)
Would you advise that pt have labwork done? Please advise.

## 2020-04-24 ENCOUNTER — Telehealth: Payer: Self-pay

## 2020-04-24 ENCOUNTER — Other Ambulatory Visit: Payer: Self-pay

## 2020-04-24 ENCOUNTER — Other Ambulatory Visit (INDEPENDENT_AMBULATORY_CARE_PROVIDER_SITE_OTHER): Payer: BC Managed Care – PPO

## 2020-04-24 DIAGNOSIS — E041 Nontoxic single thyroid nodule: Secondary | ICD-10-CM | POA: Diagnosis not present

## 2020-04-24 DIAGNOSIS — R251 Tremor, unspecified: Secondary | ICD-10-CM

## 2020-04-24 LAB — T4, FREE: Free T4: 0.79 ng/dL (ref 0.60–1.60)

## 2020-04-24 LAB — TSH: TSH: 3.22 u[IU]/mL (ref 0.35–4.50)

## 2020-04-24 NOTE — Telephone Encounter (Signed)
I called patient to give her lab results and recommendations.  Patient states that she has been symptomatic since having her Thyroid surgery on Mar 20, 2020.   Her symptoms are:   Tachycardia, legs and arms itchy, swollen face, leg and feet swelling, irregular periods and weight gain.  I told her that she had an appointment with you on 05-07-20 at 145 and that could be discussed at that appointment, she states that she wanted to know what was going on.  After reviewing care everywhere with Ammie that we did not find a post op follow up note from the surgeon.  Patient was also advised to follow up with her surgeon.  Also appears she did not follow up with this office in post op.  She states she does not understand why she is having these symptoms after surgery.  I also explained that it maybe something else going on.  She states that she would have thought that her endocrinologist would try to figure this out not her family PCP. I told her that I would take a note and someone would call her back.  Please review and advise.  Thank you

## 2020-04-24 NOTE — Telephone Encounter (Signed)
-----   Message from Cloyd Stagers, MD sent at 04/24/2020  1:13 PM EDT ----- Please let the pt know that her thyroid function is normal and symptoms are not attributed to that   Thanks

## 2020-04-26 ENCOUNTER — Other Ambulatory Visit: Payer: Self-pay | Admitting: Endocrinology

## 2020-04-26 DIAGNOSIS — R002 Palpitations: Secondary | ICD-10-CM

## 2020-04-26 NOTE — Telephone Encounter (Signed)
Please have Dr Loanne Drilling advise.

## 2020-04-26 NOTE — Telephone Encounter (Signed)
Returned pt call and advised of Dr. Cordelia Pen response below. Scheduled for labs as follows:  Next Appt With Internal Medicine ([facility]) 04/29/2020 at 9:15 AM

## 2020-04-26 NOTE — Telephone Encounter (Signed)
Pt called to follow up on this message. It was forwarded to Dr. Kelton Pillar for her to review and address if able. She has advised to forward to you and await your response. Please refer to message below and advise:   From [facility] - I called patient to give her lab results and recommendations.  Patient states that she has been symptomatic since having her Thyroid surgery on Mar 20, 2020.   Her symptoms are:   Tachycardia, legs and arms itchy, swollen face, leg and feet swelling, irregular periods and weight gain.  I told her that she had an appointment with you on 05-07-20 at 145 and that could be discussed at that appointment, she states that she wanted to know what was going on.  After reviewing care everywhere with Tailor Lucking that we did not find a post op follow up note from the surgeon.  Patient was also advised to follow up with her surgeon.  Also appears she did not follow up with this office in post op.  She states she does not understand why she is having these symptoms after surgery.  I also explained that it maybe something else going on.  She states that she would have thought that her endocrinologist would try to figure this out not her family PCP. I told her that I would take a note and someone would call her back.  Please review and advise.  Thank you

## 2020-04-26 NOTE — Telephone Encounter (Signed)
I suggest she discusses all that with her Endocrinologist Dr. Loanne Drilling.

## 2020-04-26 NOTE — Telephone Encounter (Signed)
I have ordered more labs.  Please come in to have drawn

## 2020-04-26 NOTE — Telephone Encounter (Signed)
Patient called to follow up on this encounter from the other day - stated she never got a phone call back from the office. Advised that I would route this to the on call provider for further instruction. Ph# [phone]

## 2020-04-29 ENCOUNTER — Other Ambulatory Visit (INDEPENDENT_AMBULATORY_CARE_PROVIDER_SITE_OTHER): Payer: BC Managed Care – PPO

## 2020-04-29 ENCOUNTER — Other Ambulatory Visit: Payer: Self-pay

## 2020-04-29 DIAGNOSIS — R002 Palpitations: Secondary | ICD-10-CM

## 2020-04-29 LAB — BASIC METABOLIC PANEL
BUN: 15 mg/dL (ref 6–23)
CO2: 27 mEq/L (ref 19–32)
Calcium: 9.3 mg/dL (ref 8.4–10.5)
Chloride: 102 mEq/L (ref 96–112)
Creatinine, Ser: 0.86 mg/dL (ref 0.40–1.20)
GFR: 79.82 mL/min (ref >60.00)
Glucose, Bld: 91 mg/dL (ref 70–99)
Potassium: 4 mEq/L (ref 3.5–5.1)
Sodium: 135 mEq/L (ref 135–145)

## 2020-04-29 LAB — VITAMIN D 25 HYDROXY (VIT D DEFICIENCY, FRACTURES): VITD: 22.51 ng/mL — ABNORMAL LOW (ref 30.00–100.00)

## 2020-05-02 ENCOUNTER — Other Ambulatory Visit: Payer: Self-pay

## 2020-05-05 LAB — CATECHOLAMINES, FRACTIONATED, PLASMA
Dopamine: <10 pg/mL
Epinephrine: <20 pg/mL
Norepinephrine: 161 pg/mL — ABNORMAL LOW
Total Catecholamines: 161 pg/mL — ABNORMAL LOW

## 2020-05-05 LAB — PTH, INTACT AND CALCIUM
Calcium: 9.2 mg/dL (ref 8.6–10.2)
PTH: 41 pg/mL (ref 14–64)

## 2020-05-07 ENCOUNTER — Other Ambulatory Visit: Payer: Self-pay

## 2020-05-07 ENCOUNTER — Ambulatory Visit (INDEPENDENT_AMBULATORY_CARE_PROVIDER_SITE_OTHER): Payer: BC Managed Care – PPO | Admitting: Endocrinology

## 2020-05-07 ENCOUNTER — Encounter: Payer: Self-pay | Admitting: Endocrinology

## 2020-05-07 VITALS — BP 130/84 | HR 87 | Ht 65.0 in | Wt 204.0 lb

## 2020-05-07 DIAGNOSIS — E041 Nontoxic single thyroid nodule: Secondary | ICD-10-CM | POA: Diagnosis not present

## 2020-05-07 DIAGNOSIS — E559 Vitamin D deficiency, unspecified: Secondary | ICD-10-CM

## 2020-05-07 MED ORDER — LEVOTHYROXINE SODIUM 25 MCG PO TABS: 25.0000 ug | ORAL_TABLET | Freq: Every day | ORAL | 3 refills | Status: AC

## 2020-05-07 NOTE — Progress Notes (Signed)
Subjective:    Patient ID: Joy Taylor, female    DOB: 07-Jun-1996, 24 y.o.   MRN: [id number]  HPI Pt returns for f/u of stage 1a PTC: 5/21: Right lobect Papillary thyroid microcarcinoma, 0.6 cm.            Negative for perineural or lymphovascular invasion.            Negative for extrathyroidal extension.             Margins Negative.    She has not started vitamin-D.   She reports these sxs: diffuse cramps numbness, fatigue, anxiety, blurred vision, diffuse itching, palpitations, excessive diaphoresis, diffuse swelling, and weight gain.   Past Medical History:  Diagnosis Date  . Anxiety   . Depression   . Ehlers-Danlos disease   . Fibromyalgia   . Genital herpes    diagnosed by GYN-per patient no treatment advised unless daily flare ups occur  . GERD (gastroesophageal reflux disease)   . IBS (irritable bowel syndrome)   . Kidney stones   . Low BP    post-op  . Lupus ([place])   . Migraine   . Rheumatoid arthritis ([place])   . Stomach problems     Past Surgical History:  Procedure Laterality Date  . HIP ARTHROSCOPY Right 07/15/2016, 08/06/2015  . KNEE ARTHROSCOPY Right   . NISSEN FUNDOPLICATION  85/46/2703  . ROTATOR CUFF REPAIR Right 2010  . SHOULDER ARTHROSCOPY Right 06/29/2018    Social History   Socioeconomic History  . Marital status: Single    Spouse name: Not on file  . Number of children: 0  . Years of education: 68  . Highest education level: Not on file  Occupational History  . Occupation: Optometrist  Tobacco Use  . Smoking status: Never Smoker  . Smokeless tobacco: Never Used  Vaping Use  . Vaping Use: Never used  Substance and Sexual Activity  . Alcohol use: Yes    Comment: rare-occ. per pt  . Drug use: No  . Sexual activity: Yes  Other Topics Concern  . 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 Reports this triggered all of her health problems.   Social Determinants of Health   Financial Resource Strain:   . Difficulty of Paying Living Expenses:   Food Insecurity:   . Worried About Charity fundraiser in the Last Year:   . Arboriculturist in the Last Year:   Transportation Needs:   . Film/video editor (Medical):   Marland Kitchen Lack of Transportation (Non-Medical):   Physical Activity:   . Days of Exercise per Week:   . Minutes of Exercise per Session:   Stress:   . Feeling of Stress :   Social Connections:   . Frequency of Communication with Friends and Family:   . Frequency of Social Gatherings with Friends and Family:   . Attends Religious Services:   . Active Member of Clubs or Organizations:   . Attends Archivist Meetings:   Marland Kitchen Marital Status:   Intimate Partner Violence:   . Fear of Current or Ex-Partner:   . Emotionally Abused:   Marland Kitchen Physically Abused:   . Sexually Abused:     Current  Outpatient Medications on File Prior to Visit  Medication Sig Dispense Refill  . aspirin-acetaminophen-caffeine (EXCEDRIN MIGRAINE) 250-250-65 MG tablet Take 2 tablets by mouth every 6 (six) hours as needed for headache.     . busPIRone (BUSPAR) 15 MG tablet Take 1 tablet (15 mg total) by mouth at bedtime. 30 tablet 5  . Certolizumab Pegol (CIMZIA PREFILLED) 2 X 200 MG/ML KIT Inject 400 mg into the skin every 30 (thirty) days. Last shot given 04/28/2019    . Erenumab-aooe (AIMOVIG) 70 MG/ML SOAJ Inject 70 mg into the skin every 30 (thirty) days. 1 pen 11  . escitalopram (LEXAPRO) 10 MG tablet Take 10 mg by mouth daily.    . fluticasone (FLONASE) 50 MCG/ACT nasal spray Place 2 sprays into both nostrils as needed (tries to take every other day).     Marland Kitchen gabapentin (NEURONTIN) 600 MG tablet Take 1 tablet (600 mg total) by mouth 3 (three) times daily. 90 tablet 6  . glucose monitoring kit (FREESTYLE) monitoring kit 1 each by Does not  apply route as needed for other. 1 each 0  . hyoscyamine (LEVSIN SL) 0.125 MG SL tablet Place 1 tablet (0.125 mg total) under the tongue every 6 (six) hours as needed. As needed for abdominal pain, cramping/spasm 30 tablet 1  . JUNEL 1.5/30 1.5-30 MG-MCG tablet TAKE 1 TABLET BY MOUTH EVERY DAY 63 tablet 3  . MELATONIN PO Take 2 tablets by mouth as needed.    . mirtazapine (REMERON) 15 MG tablet Take 7.5 mg by mouth at bedtime.    . Multiple Vitamins-Minerals (MULTIVITAMIN WOMEN PO) Take 1 tablet by mouth daily.    . ondansetron (ZOFRAN ODT) 4 MG disintegrating tablet Take 1 tablet (4 mg total) by mouth every 6 (six) hours as needed for nausea or vomiting. 30 tablet 3  . pilocarpine (SALAGEN) 5 MG tablet Take 5 mg by mouth 4 (four) times daily as needed (for dry mouth).    Carren Rang BALANCE 0.6 % SOLN daily.     Marland Kitchen tiZANidine (ZANAFLEX) 4 MG tablet Take 1 tablet (4 mg total) by mouth every 8 (eight) hours as needed for muscle spasms. 30 tablet 1   No current facility-administered medications on file prior to visit.    No Known Allergies  Family History  Problem Relation Age of Onset  . Diabetes Mother   . Diverticulitis Mother   . Colon polyps Mother   . Macular degeneration Maternal Grandmother   . Heart disease Maternal Grandmother   . Hypertension Maternal Grandfather   . Heart disease Maternal Grandfather   . Bone cancer Father   . Asthma Other   . Obesity Other   . Heart disease Other   . Thyroid cancer Other        uncertain cell type.  several distant relatives--all deceased  . Colon cancer Neg Hx   . Esophageal cancer Neg Hx   . Pancreatic cancer Neg Hx   . Stomach cancer Neg Hx   . Liver disease Neg Hx   . Rectal cancer Neg Hx     BP 130/84   Pulse 87   Ht _0  (1.651 m)   Wt 204 lb (92.5 kg)   SpO2 99%   BMI 33.95 kg/m   Review of Systems Neck pain is less since the surgery.      Objective:   Physical Exam VITAL SIGNS:  See vs page GENERAL: no  distress Neck: a healing scar is present.  I do not  appreciate a nodule in the thyroid or elsewhere in the neck.     Lab Results  Component Value Date   PTH 41 04/29/2020   CALCIUM 9.3 04/29/2020   CALCIUM 9.2 04/29/2020   Lab Results  Component Value Date   TSH 3.22 04/24/2020      Assessment & Plan:  PTC, new to me.  All the f/u she needs is periodic [place] S/p lobectomy. She should take low-dose synthroid. Vit-D def: she should take rx  Patient Instructions  I have sent a prescription to your pharmacy, for a small amount of thyroid hormone.  You should take this indefinitely, with the goal of a normal TSH on it.   You should take vitamin-D, 1000 units per day.  Please redo the blood tests in approx 1 month.  Please come back for a follow-up appointment in 6 months.

## 2020-05-07 NOTE — Patient Instructions (Addendum)
I have sent a prescription to your pharmacy, for a small amount of thyroid hormone.  You should take this indefinitely, with the goal of a normal TSH on it.   You should take vitamin-D, 1000 units per day.  Please redo the blood tests in approx 1 month.  Please come back for a follow-up appointment in 6 months.

## 2020-06-04 ENCOUNTER — Other Ambulatory Visit (INDEPENDENT_AMBULATORY_CARE_PROVIDER_SITE_OTHER): Payer: BC Managed Care – PPO

## 2020-06-04 ENCOUNTER — Other Ambulatory Visit: Payer: Self-pay

## 2020-06-04 DIAGNOSIS — E559 Vitamin D deficiency, unspecified: Secondary | ICD-10-CM

## 2020-06-04 DIAGNOSIS — E041 Nontoxic single thyroid nodule: Secondary | ICD-10-CM

## 2020-06-04 LAB — VITAMIN D 25 HYDROXY (VIT D DEFICIENCY, FRACTURES): VITD: 28.86 ng/mL — ABNORMAL LOW (ref 30.00–100.00)

## 2020-06-04 LAB — TSH: TSH: 2.27 u[IU]/mL (ref 0.35–4.50)

## 2020-06-04 LAB — T4, FREE: Free T4: 0.76 ng/dL (ref 0.60–1.60)

## 2020-06-26 ENCOUNTER — Telehealth: Payer: Self-pay | Admitting: Neurology

## 2020-06-26 NOTE — Telephone Encounter (Signed)
error 

## 2020-07-01 ENCOUNTER — Telehealth: Payer: Self-pay | Admitting: Neurology

## 2020-07-01 MED ORDER — AIMOVIG 70 MG/ML ~~LOC~~ SOAJ: 70.0000 mg | SUBCUTANEOUS | 10 refills | Status: DC

## 2020-07-01 NOTE — Telephone Encounter (Signed)
Dr Ishmael Holter has called asking that the prescription for pt's Erenumab-aooe ([place]) 74 MG/ML SOAJ be faxed to her office at [phone] in addition to office notes confirming pt is on Erenumab-aooe ([place]) 60 MG/ML SOAJ.  If there are questions on this request Dr Ishmael Holter is asking that [phone] be called and that the RN ask to be connected to the [facility] for one her RN's.

## 2020-07-01 NOTE — Addendum Note (Signed)
Addended by: Brandon Melnick on: 07/01/2020 02:14 PM   Modules accepted: Orders

## 2020-07-01 NOTE — Telephone Encounter (Signed)
The prescription was signed.

## 2020-07-01 NOTE — Telephone Encounter (Signed)
Attempted to call several times.  Printed last note and prescription, need signature WID then will fax.

## 2020-07-02 NOTE — Telephone Encounter (Signed)
Fax confirmation to Dr. Ishmael Holter at [facility] (prescription and last ofv note). [phone], ofv [phone].

## 2020-07-03 NOTE — Telephone Encounter (Addendum)
I called and spoke to [facility] and she gave me fax # [phone], I resubmitted to that fax # [facility] with confirmation received.

## 2020-07-09 NOTE — Telephone Encounter (Signed)
Pt has called to report that the [place] did not approve the PA on her Aimovig, pt is asking if it can be sent to [place] #[id number] Pt is asking it be known that she has been about 2 months without the medication and that her headaches are worsening.

## 2020-07-10 MED ORDER — AIMOVIG 70 MG/ML ~~LOC~~ SOAJ: 70.0000 mg | SUBCUTANEOUS | 10 refills | Status: DC

## 2020-07-10 NOTE — Addendum Note (Signed)
Addended by: Oliver Hum S on: 07/10/2020 08:00 AM   Modules accepted: Orders

## 2020-07-10 NOTE — Telephone Encounter (Addendum)
Order aimovig sent to [facility] [place], [place].

## 2020-07-10 NOTE — Telephone Encounter (Signed)
Called [facility] re: aimovig and requires PA.  They have [facility] HEALTH ID [id number] [place] [id number], SLHTD ROIRX, GRP BHIFPNC.  Attempted to use CMM for pts aimovig pa and come back as eligibility cannot be verified. LMVM for pt to call back or email for insurance verification.

## 2020-07-10 NOTE — Telephone Encounter (Signed)
Patient's [employer] ID from pharmacy is correct, just without the last 3 numbers, [id number].  Member ID: [id number]  I have submitted the PA request on CMM, Key: [id number].  [facility] has received your information, and the request will be reviewed. You may close this dialog, return to your dashboard, and perform other tasks.  You will receive an electronic determination in CoverMyMeds. You can see the latest determination by locating this request on your dashboard or by reopening this request. You will also receive a faxed copy of the determination. If you have any questions please contact [employer] at [phone].  If you need assistance, please chat with CoverMyMeds or call us at [phone].

## 2020-07-11 NOTE — Telephone Encounter (Signed)
APPROVED  Aimovig 70 mg/mL is approved through 01/06/2021.

## 2020-08-28 ENCOUNTER — Telehealth (INDEPENDENT_AMBULATORY_CARE_PROVIDER_SITE_OTHER): Payer: 59 | Admitting: Family Medicine

## 2020-08-28 ENCOUNTER — Encounter: Payer: Self-pay | Admitting: Family Medicine

## 2020-08-28 VITALS — HR 70

## 2020-08-28 DIAGNOSIS — U071 COVID-19: Secondary | ICD-10-CM

## 2020-08-28 DIAGNOSIS — R059 Cough, unspecified: Secondary | ICD-10-CM | POA: Diagnosis not present

## 2020-08-28 DIAGNOSIS — R0602 Shortness of breath: Secondary | ICD-10-CM

## 2020-08-28 MED ORDER — BENZONATATE 100 MG PO CAPS: 200.0000 mg | ORAL_CAPSULE | Freq: Two times a day (BID) | ORAL | 0 refills | Status: AC | PRN

## 2020-08-28 NOTE — Progress Notes (Signed)
Virtual Visit via Video Note I connected with Joy Taylor on 08/28/20 by a video enabled telemedicine application and verified that I am speaking with the correct person using two identifiers.  Location patient: home Location provider:work office Persons participating in the virtual visit: patient, provider  I discussed the limitations of evaluation and management by telemedicine and the availability of in person appointments. The patient expressed understanding and agreed to proceed.  Chief Complaint  Patient presents with  . Covid Positive   HPI: Joy. Joy Taylor is a 24 year old female with history of RA,LES,fibromyalgia, migraines, anxiety,depression, thyroid cancer s/p total thyroidectomy and on thyroid hormone supplementation who was recently diagnosed with COVID-19 infection and requesting oupt treatment.  She started with symptoms on 08/20/2020 and diagnosed on 08/25/2020. Evaluated at urgent care.  I do have a copy of lab result.  Nonproductive cough, sore throat, body aches, nasal congestion, and postnasal drainage. Cough seems to be worse at night.  She has noticed some changes in smell, it is "faint, no changes in taste. Positive sick contact, a friend who was sick with COVID-19. Headache that has improved.  She has had some nausea but negative for abdominal pain, vomiting, or changes in bowel habits. She has not noted a skin rash. + Fatigue and decreased appetite.  She has taking OTC DayQuil, NyQuil, and Mucinex, the latter one has helped a lot  For the past 2 days she has had shortness of breath with exertion. Negative for wheezing, she has some chest pain with coughing spells.  She is concerned about her immunosuppressed state and risk of complications. She is on Certolizumab to treat RA.  ROS: See pertinent positives and negatives per HPI.  Past Medical History:  Diagnosis Date  . Anxiety   . Depression   . Ehlers-Danlos disease   . Fibromyalgia   . Genital herpes     diagnosed by GYN-per patient no treatment advised unless daily flare ups occur  . GERD (gastroesophageal reflux disease)   . IBS (irritable bowel syndrome)   . Kidney stones   . Low BP    post-op  . Lupus ([place])   . Migraine   . Rheumatoid arthritis ([place])   . Stomach problems     Past Surgical History:  Procedure Laterality Date  . HIP ARTHROSCOPY Right 07/15/2016, 08/06/2015  . KNEE ARTHROSCOPY Right   . NISSEN FUNDOPLICATION  66/04/3015  . ROTATOR CUFF REPAIR Right 2010  . SHOULDER ARTHROSCOPY Right 06/29/2018   Family History  Problem Relation Age of Onset  . Diabetes Mother   . Diverticulitis Mother   . Colon polyps Mother   . Macular degeneration Maternal Grandmother   . Heart disease Maternal Grandmother   . Hypertension Maternal Grandfather   . Heart disease Maternal Grandfather   . Bone cancer Father   . Asthma Other   . Obesity Other   . Heart disease Other   . Thyroid cancer Other        uncertain cell type.  several distant relatives--all deceased  . Colon cancer Neg Hx   . Esophageal cancer Neg Hx   . Pancreatic cancer Neg Hx   . Stomach cancer Neg Hx   . Liver disease Neg Hx   . Rectal cancer Neg Hx     Social History   Socioeconomic History  . Marital status: Single    Spouse name: Not on file  . Number of children: 0  . Years of education: 75  . Highest education level: Not on  file  Occupational History  . Occupation: Optometrist  Tobacco Use  . Smoking status: Never Smoker  . Smokeless tobacco: Never Used  Vaping Use  . Vaping Use: Never used  Substance and Sexual Activity  . Alcohol use: Yes    Comment: rare-occ. per pt  . Drug use: No  . Sexual activity: Yes  Other Topics Concern  . 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 Reports this  triggered all of her health problems.   Social Determinants of Health   Financial Resource Strain:   . Difficulty of Paying Living Expenses: Not on file  Food Insecurity:   . Worried About Charity fundraiser in the Last Year: Not on file  . Ran Out of Food in the Last Year: Not on file  Transportation Needs:   . Lack of Transportation (Medical): Not on file  . Lack of Transportation (Non-Medical): Not on file  Physical Activity:   . Days of Exercise per Week: Not on file  . Minutes of Exercise per Session: Not on file  Stress:   . Feeling of Stress : Not on file  Social Connections:   . Frequency of Communication with Friends and Family: Not on file  . Frequency of Social Gatherings with Friends and Family: Not on file  . Attends Religious Services: Not on file  . Active Member of Clubs or Organizations: Not on file  . Attends Archivist Meetings: Not on file  . Marital Status: Not on file  Intimate Partner Violence:   . Fear of Current or Ex-Partner: Not on file  . Emotionally Abused: Not on file  . Physically Abused: Not on file  . Sexually Abused: Not on file    Current Outpatient Medications:  .  aspirin-acetaminophen-caffeine (EXCEDRIN MIGRAINE) 250-250-65 MG tablet, Take 2 tablets by mouth every 6 (six) hours as needed for headache. , Disp: , Rfl:  .  busPIRone (BUSPAR) 15 MG tablet, Take 1 tablet (15 mg total) by mouth at bedtime., Disp: 30 tablet, Rfl: 5 .  Certolizumab Pegol (CIMZIA PREFILLED) 2 X 200 MG/ML KIT, Inject 400 mg into the skin every 30 (thirty) days. Last shot given 04/28/2019, Disp: , Rfl:  .  Erenumab-aooe (AIMOVIG) 70 MG/ML SOAJ, Inject 70 mg into the skin every 30 (thirty) days., Disp: 1 mL, Rfl: 10 .  escitalopram (LEXAPRO) 10 MG tablet, Take 10 mg by mouth daily., Disp: , Rfl:  .  fluticasone (FLONASE) 50 MCG/ACT nasal spray, Place 2 sprays into both nostrils as needed (tries to take every other day). , Disp: , Rfl:  .  gabapentin  (NEURONTIN) 600 MG tablet, Take 1 tablet (600 mg total) by mouth 3 (three) times daily., Disp: 90 tablet, Rfl: 6 .  glucose monitoring kit (FREESTYLE) monitoring kit, 1 each by Does not apply route as needed for other., Disp: 1 each, Rfl: 0 .  hyoscyamine (LEVSIN SL) 0.125 MG SL tablet, Place 1 tablet (0.125 mg total) under the tongue every 6 (six) hours as needed. As needed for abdominal pain, cramping/spasm, Disp: 30 tablet, Rfl: 1 .  JUNEL 1.5/30 1.5-30 MG-MCG tablet, TAKE 1 TABLET BY MOUTH EVERY DAY, Disp: 63 tablet, Rfl: 3 .  levothyroxine (SYNTHROID) 25  MCG tablet, Take 1 tablet (25 mcg total) by mouth daily before breakfast., Disp: 90 tablet, Rfl: 3 .  MELATONIN PO, Take 2 tablets by mouth as needed., Disp: , Rfl:  .  mirtazapine (REMERON) 15 MG tablet, Take 7.5 mg by mouth at bedtime., Disp: , Rfl:  .  Multiple Vitamins-Minerals (MULTIVITAMIN WOMEN PO), Take 1 tablet by mouth daily., Disp: , Rfl:  .  ondansetron (ZOFRAN ODT) 4 MG disintegrating tablet, Take 1 tablet (4 mg total) by mouth every 6 (six) hours as needed for nausea or vomiting., Disp: 30 tablet, Rfl: 3 .  pilocarpine (SALAGEN) 5 MG tablet, Take 5 mg by mouth 4 (four) times daily as needed (for dry mouth)., Disp: , Rfl:  .  SYSTANE BALANCE 0.6 % SOLN, daily. , Disp: , Rfl:  .  tiZANidine (ZANAFLEX) 4 MG tablet, Take 1 tablet (4 mg total) by mouth every 8 (eight) hours as needed for muscle spasms., Disp: 30 tablet, Rfl: 1 .  benzonatate (TESSALON) 100 MG capsule, Take 2 capsules (200 mg total) by mouth 2 (two) times daily as needed for up to 10 days., Disp: 30 capsule, Rfl: 0  EXAM:  VITALS per patient if applicable:Pulse 70   LMP 08/01/2020 (LMP Unknown)   SpO2 94%   GENERAL: alert, oriented, appears well and in no acute distress  HEENT: atraumatic, conjunctiva clear, no obvious abnormalities on inspection of external nose and ears  NECK: normal movements of the head and neck  LUNGS: on inspection no signs of respiratory  distress, breathing rate appears normal, no obvious gross SOB, gasping or wheezing. No cough during visit.  CV: no obvious cyanosis  PSYCH/NEURO: pleasant and cooperative, no obvious depression, + anxious. Speech and thought processing grossly intact  ASSESSMENT AND PLAN:  Discussed the following assessment and plan: Orders Placed This Encounter  Procedures  . DG Chest 2 View  . CBC with Differential/Platelet  . C-reactive protein   Shortness of breath  On inspection today during visit there is no respiratory distress. Further recommendation will be given according to imaging and blood work. Instructed about warning signs.  Address to [facility] given.  COVID-19 virus infection  We discussed diagnosis, prognosis, and treatment options. Explained that oral treatment for COVID-19 infection has not yet been approved by the FDA. Monoclonal antibody infusion is an option, which she would like to have done. Phone number of the infusion center was given, I sent a message to infusion team. She was instructed to call today.  She was clearly instructed about warning signs, she was understanding. States that she does not need a note for work.  Cough - Plan: benzonatate (TESSALON) 100 MG capsule Cough can still be present for a few days and even weeks after acute symptoms have resolved. Symptomatic treatment with benzonatate may help. Adequate hydration. Continue Mucinex.  I discussed the assessment and treatment plan with the patient. Joy Taylor was provided an opportunity to ask questions and all were answered. She agreed with the plan and demonstrated an understanding of the instructions.  Return if symptoms worsen or fail to improve.    Betty Martinique, MD

## 2020-08-29 ENCOUNTER — Other Ambulatory Visit: Payer: Self-pay | Admitting: Nurse Practitioner

## 2020-08-29 DIAGNOSIS — M069 Rheumatoid arthritis, unspecified: Secondary | ICD-10-CM

## 2020-08-29 DIAGNOSIS — U071 COVID-19: Secondary | ICD-10-CM

## 2020-08-29 DIAGNOSIS — M329 Systemic lupus erythematosus, unspecified: Secondary | ICD-10-CM

## 2020-08-29 NOTE — Progress Notes (Signed)
I connected by phone with [facility] on 08/29/2020 at 10:33 AM to discuss the potential use of a new treatment for mild to moderate COVID-19 viral infection in non-hospitalized patients.  This patient is a 24 y.o. female that meets the FDA criteria for Emergency Use Authorization of COVID monoclonal antibody casirivimab/imdevimab or bamlanivimab/eteseviamb.  Has a (+) direct SARS-CoV-2 viral test result  Has mild or moderate COVID-19   Is NOT hospitalized due to COVID-19  Is within 10 days of symptom onset  Has at least one of the high risk factor(s) for progression to severe COVID-19 and/or hospitalization as defined in EUA.  Specific high risk criteria : BMI > 25 and Immunosuppressive Disease or Treatment   I have spoken and communicated the following to the patient or parent/caregiver regarding COVID monoclonal antibody treatment:  1. FDA has authorized the emergency use for the treatment of mild to moderate COVID-19 in adults and pediatric patients with positive results of direct SARS-CoV-2 viral testing who are 84 years of age and older weighing at least 40 kg, and who are at high risk for progressing to severe COVID-19 and/or hospitalization.  2. The significant known and potential risks and benefits of COVID monoclonal antibody, and the extent to which such potential risks and benefits are unknown.  3. Information on available alternative treatments and the risks and benefits of those alternatives, including clinical trials.  4. Patients treated with COVID monoclonal antibody should continue to self-isolate and use infection control measures (e.g., wear mask, isolate, social distance, avoid sharing personal items, clean and disinfect "high touch" surfaces, and frequent handwashing) according to CDC guidelines.   5. The patient or parent/caregiver has the option to accept or refuse COVID monoclonal antibody treatment.  After reviewing this information with the patient, the patient  has agreed to receive one of the available covid 19 monoclonal antibodies and will be provided an appropriate fact sheet prior to infusion. Jobe Gibbon, NP 08/29/2020 10:33 AM

## 2020-08-30 ENCOUNTER — Other Ambulatory Visit: Payer: Self-pay

## 2020-08-30 ENCOUNTER — Ambulatory Visit (HOSPITAL_COMMUNITY)
Admission: RE | Admit: 2020-08-30 | Discharge: 2020-08-30 | Disposition: A | Discharge status: Home / Self Care | Payer: 59 | Source: Ambulatory Visit | Attending: Pulmonary Disease | Admitting: Pulmonary Disease

## 2020-08-30 ENCOUNTER — Other Ambulatory Visit (INDEPENDENT_AMBULATORY_CARE_PROVIDER_SITE_OTHER): Payer: 59

## 2020-08-30 ENCOUNTER — Ambulatory Visit (INDEPENDENT_AMBULATORY_CARE_PROVIDER_SITE_OTHER)
Admission: RE | Admit: 2020-08-30 | Discharge: 2020-08-30 | Disposition: A | Discharge status: Home / Self Care | Payer: 59 | Source: Ambulatory Visit | Attending: Family Medicine | Admitting: Family Medicine

## 2020-08-30 DIAGNOSIS — R0602 Shortness of breath: Secondary | ICD-10-CM

## 2020-08-30 DIAGNOSIS — U071 COVID-19: Secondary | ICD-10-CM | POA: Insufficient documentation

## 2020-08-30 DIAGNOSIS — M329 Systemic lupus erythematosus, unspecified: Secondary | ICD-10-CM | POA: Insufficient documentation

## 2020-08-30 DIAGNOSIS — M069 Rheumatoid arthritis, unspecified: Secondary | ICD-10-CM | POA: Insufficient documentation

## 2020-08-30 LAB — CBC WITH DIFFERENTIAL/PLATELET
Basophils Absolute: 0.1 10*3/uL (ref 0.0–0.1)
Basophils Relative: 0.6 % (ref 0.0–3.0)
Eosinophils Absolute: 0.5 10*3/uL (ref 0.0–0.7)
Eosinophils Relative: 6 % — ABNORMAL HIGH (ref 0.0–5.0)
HCT: 35.9 % — ABNORMAL LOW (ref 36.0–46.0)
Hemoglobin: 12 g/dL (ref 12.0–15.0)
Lymphocytes Relative: 27 % (ref 12.0–46.0)
Lymphs Abs: 2.4 10*3/uL (ref 0.7–4.0)
MCHC: 33.5 g/dL (ref 30.0–36.0)
MCV: 83.5 fl (ref 78.0–100.0)
Monocytes Absolute: 0.9 10*3/uL (ref 0.1–1.0)
Monocytes Relative: 10.6 % (ref 3.0–12.0)
Neutro Abs: 5 10*3/uL (ref 1.4–7.7)
Neutrophils Relative %: 55.8 % (ref 43.0–77.0)
Platelets: 301 10*3/uL (ref 150.0–400.0)
RBC: 4.3 Mil/uL (ref 3.87–5.11)
RDW: 15.3 % (ref 11.5–15.5)
WBC: 8.9 10*3/uL (ref 4.0–10.5)

## 2020-08-30 LAB — C-REACTIVE PROTEIN: CRP: 1.4 mg/dL (ref 0.5–20.0)

## 2020-08-30 MED ORDER — METHYLPREDNISOLONE SODIUM SUCC 125 MG IJ SOLR: 125.0000 mg | Freq: Once | INTRAMUSCULAR | Status: DC | PRN

## 2020-08-30 MED ORDER — FAMOTIDINE IN NACL 20-0.9 MG/50ML-% IV SOLN: 20.0000 mg | Freq: Once | INTRAVENOUS | Status: DC | PRN

## 2020-08-30 MED ORDER — SODIUM CHLORIDE 0.9 % IV SOLN: INTRAVENOUS | Status: DC | PRN

## 2020-08-30 MED ORDER — SODIUM CHLORIDE 0.9 % IV SOLN: Freq: Once | INTRAVENOUS | Status: AC

## 2020-08-30 MED ORDER — ALBUTEROL SULFATE HFA 108 (90 BASE) MCG/ACT IN AERS: 2.0000 | INHALATION_SPRAY | Freq: Once | RESPIRATORY_TRACT | Status: DC | PRN

## 2020-08-30 MED ORDER — EPINEPHRINE 0.3 MG/0.3ML IJ SOAJ: 0.3000 mg | Freq: Once | INTRAMUSCULAR | Status: DC | PRN

## 2020-08-30 MED ORDER — DIPHENHYDRAMINE HCL 50 MG/ML IJ SOLN: 50.0000 mg | Freq: Once | INTRAMUSCULAR | Status: DC | PRN

## 2020-08-30 NOTE — Addendum Note (Signed)
Addended by: Boris Lown B on: 08/30/2020 11:32 AM   Modules accepted: Orders

## 2020-08-30 NOTE — Progress Notes (Signed)
  Diagnosis: COVID-19  Physician: Dr. Joya Gaskins  Procedure: [facility] Med: bamlanivimab\etesevimab infusion - Provided patient with bamlanimivab\etesevimab fact sheet for patients, parents and caregivers prior to infusion.  Complications: No immediate complications noted.  Discharge: Discharged home   Joy Taylor 08/30/2020

## 2020-08-30 NOTE — Discharge Instructions (Signed)

## 2020-10-09 ENCOUNTER — Telehealth: Payer: Self-pay | Admitting: Neurology

## 2020-10-09 NOTE — Progress Notes (Deleted)
Virtual Visit via Video Note  I connected with Joy Taylor on 10/09/20 at  3:45 PM EST by a video enabled telemedicine application and verified that I am speaking with the correct person using two identifiers.  Location: Patient: *** Provider: ***   I discussed the limitations of evaluation and management by telemedicine and the availability of in person appointments. The patient expressed understanding and agreed to proceed.  History of Present Illness: 10/09/2020 SS: Joy Taylor is a 24 year old female with history of fibromyalgia, migraine headaches, RA, and lupus.  She is off Depakote.  Is on Aimovig 70 mg monthly injection.  Takes gabapentin for fibromyalgia.  04/02/2020 SS: Joy Taylor 24 year old female with history of fibromyalgia and migraine headaches.  She also has history of rheumatoid arthritis and lupus.  She is now off Depakote.  She is on Aimovig 70 mg.  She is on gabapentin for fibromyalgia.  Her headaches are 80-85% better, only gets them now around her menstrual cycle, are less intense.  She will take Excedrin Migraine.  Is tolerating Aimovig, does have some constipation, but manages with stool softener.  She recently was diagnosed with thyroid cancer, had a hemithyroidectomy.  For fibromyalgia, she has previously tried Cymbalta and nortriptyline without benefit.  She says she has previously been to the pain clinic, but they released her.  She has recently graduated from college.  She presents today for evaluation via virtual visit.   Observations/Objective:   Assessment and Plan:   Follow Up Instructions:    I discussed the assessment and treatment plan with the patient. The patient was provided an opportunity to ask questions and all were answered. The patient agreed with the plan and demonstrated an understanding of the instructions.   The patient was advised to call back or seek an in-person evaluation if the symptoms worsen or if the condition fails to improve as  anticipated.  I provided *** minutes of non-face-to-face time during this encounter.   Suzzanne Cloud, NP

## 2020-10-21 ENCOUNTER — Encounter: Payer: Self-pay | Admitting: Neurology

## 2020-10-21 ENCOUNTER — Telehealth (INDEPENDENT_AMBULATORY_CARE_PROVIDER_SITE_OTHER): Payer: 59 | Admitting: Neurology

## 2020-10-21 DIAGNOSIS — G43809 Other migraine, not intractable, without status migrainosus: Secondary | ICD-10-CM | POA: Diagnosis not present

## 2020-10-21 MED ORDER — GABAPENTIN 600 MG PO TABS: 600.0000 mg | ORAL_TABLET | Freq: Three times a day (TID) | ORAL | 6 refills | Status: DC

## 2020-10-21 MED ORDER — AIMOVIG 70 MG/ML ~~LOC~~ SOAJ: 70.0000 mg | SUBCUTANEOUS | 10 refills | Status: DC

## 2020-10-21 NOTE — Telephone Encounter (Signed)
Appt was completed

## 2020-10-21 NOTE — Progress Notes (Signed)
I have read the note, and I agree with the clinical assessment and plan.  Joy Taylor K Joy Taylor   
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
## 2020-10-24 ENCOUNTER — Other Ambulatory Visit: Payer: Self-pay | Admitting: Family Medicine

## 2020-10-29 ENCOUNTER — Telehealth: Payer: Self-pay | Admitting: *Deleted

## 2020-10-29 ENCOUNTER — Ambulatory Visit: Payer: BC Managed Care – PPO | Admitting: Endocrinology

## 2020-10-29 NOTE — Telephone Encounter (Signed)
-----   Message from Suzzanne Cloud, NP sent at 10/21/2020  4:29 PM EST ----- Can you put on list to watch for message if she can see PCP, otherwise needs 6 month follow-up some new concerns on telephone today, needs physical exam

## 2020-11-05 ENCOUNTER — Telehealth: Payer: Self-pay | Admitting: Neurology

## 2020-11-05 NOTE — Telephone Encounter (Signed)
Noted.  Placed on cancellation list. I called pt she is seeing pcp tomorrow about her neck an back issues.  If needs neuro will see MD for new problem.Marland Kitchen

## 2020-11-05 NOTE — Telephone Encounter (Signed)
Just FYI I have added this pt to the March schedule due to her migraines getting worse and making her neck and back hurt.

## 2020-11-06 ENCOUNTER — Other Ambulatory Visit: Payer: Self-pay

## 2020-11-06 ENCOUNTER — Ambulatory Visit (INDEPENDENT_AMBULATORY_CARE_PROVIDER_SITE_OTHER): Payer: 59 | Admitting: Family Medicine

## 2020-11-06 ENCOUNTER — Encounter: Payer: Self-pay | Admitting: Family Medicine

## 2020-11-06 VITALS — BP 124/80 | HR 85 | Resp 16 | Ht 65.0 in | Wt 238.0 lb

## 2020-11-06 DIAGNOSIS — M797 Fibromyalgia: Secondary | ICD-10-CM | POA: Diagnosis not present

## 2020-11-06 DIAGNOSIS — M542 Cervicalgia: Secondary | ICD-10-CM | POA: Insufficient documentation

## 2020-11-06 DIAGNOSIS — R200 Anesthesia of skin: Secondary | ICD-10-CM

## 2020-11-06 DIAGNOSIS — G894 Chronic pain syndrome: Secondary | ICD-10-CM | POA: Diagnosis not present

## 2020-11-06 DIAGNOSIS — M545 Low back pain, unspecified: Secondary | ICD-10-CM | POA: Insufficient documentation

## 2020-11-06 DIAGNOSIS — M5441 Lumbago with sciatica, right side: Secondary | ICD-10-CM | POA: Diagnosis not present

## 2020-11-06 DIAGNOSIS — G5603 Carpal tunnel syndrome, bilateral upper limbs: Secondary | ICD-10-CM

## 2020-11-06 DIAGNOSIS — N3941 Urge incontinence: Secondary | ICD-10-CM

## 2020-11-06 DIAGNOSIS — R202 Paresthesia of skin: Secondary | ICD-10-CM

## 2020-11-06 DIAGNOSIS — G8929 Other chronic pain: Secondary | ICD-10-CM | POA: Insufficient documentation

## 2020-11-06 MED ORDER — CELECOXIB 100 MG PO CAPS: 100.0000 mg | ORAL_CAPSULE | Freq: Two times a day (BID) | ORAL | 1 refills | Status: DC

## 2020-11-06 NOTE — Patient Instructions (Addendum)
A few things to remember from today's visit:   Chronic pain syndrome  Fibromyalgia  Cervicalgia - Plan: MR Cervical Spine Wo Contrast  Chronic bilateral low back pain with right-sided sciatica - Plan: MR Lumbar Spine Wo Contrast  Numbness and tingling - Plan: Vitamin B12, Hemoglobin A1c  Bilateral carpal tunnel syndrome  If you need refills please call your pharmacy. Do not use My Chart to request refills or for acute issues that need immediate attention.   Celebrex 100 mg 2 times daily.  Continue Gabapentin. MRI's will be arranged. Wt loss will help, so continue working on it. Low impact exercise and good sleep.  Please be sure medication list is accurate. If a new problem present, please set up appointment sooner than planned today.

## 2020-11-06 NOTE — Progress Notes (Signed)
Chief Complaint  Patient presents with  . Neck Pain  . Back Pain   HPI: [name] is a 24 y.o. female with hx of RA,fibromyalgia,migraines,lupus, and thyroid cancer s/p partial thyroidectomy here today complaining of worsening back pain. Hx of chronic back pain since 2016, injury while she was in the [employer].  Pain is getting worse for the past several years. Pain is interfering with sleep.  Cervical pain is aggravating headaches. Intermittent stubbing like pain,R>L. Limited ROM. Movement sometimes exacerbate pain. + Muscle spasm. PT and massage therapy, discharged in 01/2019.  Night time hands numbness since 04/2020. Alleviated by movement.  She is not taking medication for pain. She has seen pain specialist. She has had cortisone shots before, did not feel like it was helping.  She has noted a "hump" in upper back, even though she tries to have a good posture through the day.  Lumbar pain R>L. S/P right femoral head fracture in 2016. Sometimes pain radiates RLE,shooting like pain. Feet get numb. Numbness is new.  About 7-8 months ago, harder to hold the urine, urgency,and "little incontinence." Negative for dysuria or gross hematuria.  Lab Results  Component Value Date   TSH 2.27 06/04/2020   She has gained some wt. She is limiting sugar intake, increased water intake. Stationary bike and small trampling, exercising 3 times per week. Stretching lately.  Lab Results  Component Value Date   HGBA1C 5.2 02/21/2020    Review of Systems  Constitutional: Positive for fatigue. Negative for activity change, appetite change and fever.  HENT: Negative for mouth sores, nosebleeds, sore throat and trouble swallowing.   Respiratory: Negative for cough, shortness of breath and wheezing.   Cardiovascular: Negative for chest pain, palpitations and leg swelling.  Gastrointestinal: Negative for abdominal pain, nausea and vomiting.       Negative for changes in bowel  habits.  Genitourinary: Negative for pelvic pain, vaginal bleeding and vaginal discharge.  Musculoskeletal: Positive for arthralgias and back pain. Negative for gait problem.  Neurological: Negative for syncope, facial asymmetry and weakness.  Psychiatric/Behavioral: Positive for sleep disturbance. Negative for confusion. The patient is nervous/anxious.   Rest see pertinent positives and negatives per HPI.  Current Outpatient Medications on File Prior to Visit  Medication Sig Dispense Refill  . aspirin-acetaminophen-caffeine (EXCEDRIN MIGRAINE) 250-250-65 MG tablet Take 2 tablets by mouth every 6 (six) hours as needed for headache.     Rosalia Hammers 1.5/30 1.5-30 MG-MCG tablet TAKE 1 TABLET BY MOUTH EVERY DAY 63 tablet 0  . Certolizumab Pegol 2 X 200 MG/ML KIT Inject 400 mg into the skin every 30 (thirty) days. Last shot given 04/28/2019    . Erenumab-aooe (AIMOVIG) 70 MG/ML SOAJ Inject 70 mg into the skin every 30 (thirty) days. 1 mL 10  . escitalopram (LEXAPRO) 10 MG tablet Take 10 mg by mouth daily.    . fluticasone (FLONASE) 50 MCG/ACT nasal spray Place 2 sprays into both nostrils as needed (tries to take every other day).     Marland Kitchen gabapentin (NEURONTIN) 600 MG tablet Take 1 tablet (600 mg total) by mouth 3 (three) times daily. 90 tablet 6  . glucose monitoring kit (FREESTYLE) monitoring kit 1 each by Does not apply route as needed for other. 1 each 0  . hyoscyamine (LEVSIN SL) 0.125 MG SL tablet Place 1 tablet (0.125 mg total) under the tongue every 6 (six) hours as needed. As needed for abdominal pain, cramping/spasm 30 tablet 1  . levothyroxine (SYNTHROID) 25  MCG tablet Take 1 tablet (25 mcg total) by mouth daily before breakfast. 90 tablet 3  . MELATONIN PO Take 2 tablets by mouth as needed.    . mirtazapine (REMERON) 15 MG tablet Take 7.5 mg by mouth at bedtime.    . Multiple Vitamins-Minerals (MULTIVITAMIN WOMEN PO) Take 1 tablet by mouth daily.    . ondansetron (ZOFRAN ODT) 4 MG  disintegrating tablet Take 1 tablet (4 mg total) by mouth every 6 (six) hours as needed for nausea or vomiting. 30 tablet 3  . SYSTANE BALANCE 0.6 % SOLN daily.     Marland Kitchen tiZANidine (ZANAFLEX) 4 MG tablet Take 1 tablet (4 mg total) by mouth every 8 (eight) hours as needed for muscle spasms. 30 tablet 1   No current facility-administered medications on file prior to visit.   Past Medical History:  Diagnosis Date  . Anxiety   . Depression   . Ehlers-Danlos disease   . Fibromyalgia   . Genital herpes    diagnosed by GYN-per patient no treatment advised unless daily flare ups occur  . GERD (gastroesophageal reflux disease)   . IBS (irritable bowel syndrome)   . Kidney stones   . Low BP    post-op  . Lupus ([place])   . Migraine   . Rheumatoid arthritis ([place])   . Stomach problems    No Known Allergies  Social History   Socioeconomic History  . Marital status: Single    Spouse name: Not on file  . Number of children: 0  . Years of education: 62  . Highest education level: Not on file  Occupational History  . Occupation: Optometrist  Tobacco Use  . Smoking status: Never Smoker  . Smokeless tobacco: Never Used  Vaping Use  . Vaping Use: Never used  Substance and Sexual Activity  . Alcohol use: Yes    Comment: rare-occ. per pt  . Drug use: No  . Sexual activity: Yes  Other Topics Concern  . 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 Reports this triggered all of her health problems.   Social Determinants of Health   Financial Resource Strain: Not on file  Food Insecurity: Not on file  Transportation Needs: Not on file  Physical Activity: Not on file  Stress: Not on file  Social Connections: Not on file   Vitals:   11/06/20 1135  BP: 124/80  Pulse: 85  Resp: 16  SpO2: 100%    Body mass index is 39.61 kg/m.  Physical Exam Vitals and nursing note reviewed.  Constitutional:      General: She is not in acute distress.    Appearance: She is well-developed.  HENT:     Head: Normocephalic and atraumatic.     Mouth/Throat:     Mouth: Oropharynx is clear and moist and mucous membranes are normal.  Eyes:     Conjunctiva/sclera: Conjunctivae normal.     Pupils: Pupils are equal, round, and reactive to light.  Cardiovascular:     Rate and Rhythm: Normal rate and regular rhythm.     Pulses:          Dorsalis pedis pulses are 2+ on the right side and 2+ on the left side.  Heart sounds: No murmur heard.   Pulmonary:     Effort: Pulmonary effort is normal. No respiratory distress.     Breath sounds: Normal breath sounds.  Abdominal:     Palpations: Abdomen is soft. There is no hepatomegaly or mass.     Tenderness: There is no abdominal tenderness.  Musculoskeletal:        General: No edema.     Cervical back: Tenderness present. No bony tenderness. Pain with movement present. Normal range of motion.     Thoracic back: Tenderness present. No bony tenderness.     Lumbar back: Tenderness present. No bony tenderness. Negative right straight leg raise test and negative left straight leg raise test.     Comments: Can walk on tip toes.  Tender trigger points in upper and lower back, chest wall, and extremities. Tinel and Phalen negative, bilateral. No signs of synovitis.  Lymphadenopathy:     Cervical: No cervical adenopathy.  Skin:    General: Skin is warm.     Findings: No erythema or rash.  Neurological:     Mental Status: She is alert and oriented to person, place, and time.     Cranial Nerves: No cranial nerve deficit.     Gait: Gait normal.     Deep Tendon Reflexes: Strength normal.     Reflex Scores:      Patellar reflexes are 2+ on the right side and 2+ on the left side. Psychiatric:        Mood and Affect: Mood and affect normal.     Comments:  Well groomed, good eye contact.   ASSESSMENT AND PLAN:  [name] was seen today for neck pain and back pain.  Diagnoses and all orders for this visit: Orders Placed This Encounter  Procedures  . MR Lumbar Spine Wo Contrast  . MR Cervical Spine Wo Contrast  . Vitamin B12  . Hemoglobin A1c    Chronic pain syndrome Celebrex 100 mg bid prn recommended. May need considered opioid treatment.  Fibromyalgia Cymbalta caused suicidal thoughts. Low impact exercise and good sleep hygiene. Continue Gabapentin 600 mg tid.  -     celecoxib (CELEBREX) 100 MG capsule; Take 1 capsule (100 mg total) by mouth 2 (two) times daily.  Cervicalgia Getting worse. ROM exercises. Will hold on PT until imaging results are back.  -     celecoxib (CELEBREX) 100 MG capsule; Take 1 capsule (100 mg total) by mouth 2 (two) times daily.  Chronic bilateral low back pain with right-sided sciatica Celebrex side effects discussed. Local heat/ice. Wt loss will help.  -     celecoxib (CELEBREX) 100 MG capsule; Take 1 capsule (100 mg total) by mouth 2 (two) times daily.  Numbness and tingling Possible etiologies discussed. Further recommendations according to lab results. Recommend addressing problem with neurologist.  Bilateral carpal tunnel syndrome Nigh wrist splint may help.  Urgency incontinence We did not collect urine today. Next visit UA will be ordered. Kegel/pelvic floor exercises are good options to start with. Myrbetriq is a good options.  Severe obesity (BMI 35.0-39.9) with comorbidity ([place]) She understands the benefits of wt loss as well as adverse effects of obesity. Some of her meds can be contributing factors. Consistency with healthy diet and physical activity recommended.  Spent 42 minutes.  During this time history was obtained and documented, examination was performed, prior labs reviewed, and assessment/plan discussed.  Return in about 6 weeks (around 12/18/2020).   Alondria Mousseau G.  Martinique, MD  [facility]. [facility] office.  A few things to remember from today's visit:   Chronic pain syndrome  Fibromyalgia  Cervicalgia - Plan: MR Cervical Spine Wo Contrast  Chronic bilateral low back pain with right-sided sciatica - Plan: MR Lumbar Spine Wo Contrast  Numbness and tingling - Plan: Vitamin B12, Hemoglobin A1c  Bilateral carpal tunnel syndrome  If you need refills please call your pharmacy. Do not use My Chart to request refills or for acute issues that need immediate attention.   Celebrex 100 mg 2 times daily.  Continue Gabapentin. MRI's will be arranged. Wt loss will help, so continue working on it. Low impact exercise and good sleep.  Please be sure medication list is accurate. If a new problem present, please set up appointment sooner than planned today.

## 2020-11-07 LAB — VITAMIN B12: Vitamin B-12: 512 pg/mL (ref 200–1100)

## 2020-11-07 LAB — HEMOGLOBIN A1C
Hgb A1c MFr Bld: 5.3 % of total Hgb (ref <5.7)
Mean Plasma Glucose: 105 mg/dL
eAG (mmol/L): 5.8 mmol/L

## 2020-11-11 NOTE — Telephone Encounter (Signed)
I returned the call to the patient. For now, she will keep her pending appt on 01/17/20. She has pending cervical and lumbar MRI scans ordered by Dr. [place]. If the results indicate a neurological etiology, she will request a referral to see Dr. Anne Hahn for the new issues.

## 2020-11-12 ENCOUNTER — Telehealth: Payer: Self-pay | Admitting: Family Medicine

## 2020-11-12 NOTE — Telephone Encounter (Signed)
pt requesting a refill  HYDROcodone-acetaminophen (NORCO) 10-325 MG tablet- [id number] [place],  - [address] AT [facility] & [address]  Phone:  [phone] Fax:  [phone]

## 2020-12-01 ENCOUNTER — Other Ambulatory Visit: Payer: 59

## 2020-12-06 ENCOUNTER — Telehealth: Payer: Self-pay | Admitting: Family Medicine

## 2020-12-06 NOTE — Telephone Encounter (Signed)
Patient is calling and stated that the pharmacy needs PA for Celebrex and will send a fax over for provider to complete, please advise. CB is

## 2020-12-06 NOTE — Telephone Encounter (Signed)
Copy faxed back to pharmacy.

## 2020-12-06 NOTE — Telephone Encounter (Signed)
PA approved through 12/06/2021.

## 2020-12-10 ENCOUNTER — Encounter: Payer: Self-pay | Admitting: Family Medicine

## 2020-12-10 ENCOUNTER — Ambulatory Visit
Admission: RE | Admit: 2020-12-10 | Discharge: 2020-12-10 | Disposition: A | Discharge status: Home / Self Care | Payer: 59 | Source: Ambulatory Visit | Attending: Family Medicine | Admitting: Family Medicine

## 2020-12-10 DIAGNOSIS — G8929 Other chronic pain: Secondary | ICD-10-CM

## 2020-12-10 DIAGNOSIS — M542 Cervicalgia: Secondary | ICD-10-CM

## 2020-12-20 ENCOUNTER — Ambulatory Visit: Payer: 59 | Admitting: Family Medicine

## 2020-12-23 ENCOUNTER — Other Ambulatory Visit: Payer: Self-pay

## 2020-12-23 ENCOUNTER — Telehealth: Payer: Self-pay | Admitting: Neurology

## 2020-12-23 ENCOUNTER — Telehealth: Payer: Self-pay

## 2020-12-23 ENCOUNTER — Ambulatory Visit: Payer: 59 | Admitting: Family Medicine

## 2020-12-23 DIAGNOSIS — G8929 Other chronic pain: Secondary | ICD-10-CM

## 2020-12-23 DIAGNOSIS — M797 Fibromyalgia: Secondary | ICD-10-CM

## 2020-12-23 DIAGNOSIS — M542 Cervicalgia: Secondary | ICD-10-CM

## 2020-12-23 DIAGNOSIS — M5441 Lumbago with sciatica, right side: Secondary | ICD-10-CM

## 2020-12-23 MED ORDER — CELECOXIB 100 MG PO CAPS: 100.0000 mg | ORAL_CAPSULE | Freq: Two times a day (BID) | ORAL | 1 refills | Status: DC

## 2020-12-23 MED ORDER — NORETHINDRONE ACET-ETHINYL EST 1.5-30 MG-MCG PO TABS
1.0000 | ORAL_TABLET | Freq: Every day | ORAL | 3 refills | Status: DC
Start: 2020-12-23 — End: 2020-12-24

## 2020-12-23 NOTE — Telephone Encounter (Signed)
Pt called to update insurance info: Jearld Shines: [id number] Group ID: [id number] PCN: [id number]  Have the nurse give a call to confirm she has received insurance info.

## 2020-12-23 NOTE — Telephone Encounter (Signed)
Prior authorization has been initiated to cover my meds for Aimovig. (Key: [id number])

## 2020-12-24 ENCOUNTER — Other Ambulatory Visit: Payer: Self-pay | Admitting: Family Medicine

## 2020-12-24 NOTE — Telephone Encounter (Signed)
PA has been approved for the 2022 year. Approval is effective 12/23/2020- 12/22/2021 reference number is [id number].  Pt notified via my chart.

## 2021-01-14 ENCOUNTER — Telehealth: Payer: Self-pay

## 2021-01-14 ENCOUNTER — Encounter: Payer: Self-pay | Admitting: Orthopaedic Surgery

## 2021-01-14 ENCOUNTER — Ambulatory Visit (INDEPENDENT_AMBULATORY_CARE_PROVIDER_SITE_OTHER): Payer: 59 | Admitting: Orthopaedic Surgery

## 2021-01-14 VITALS — BP 122/80 | HR 108 | Ht 65.0 in | Wt 235.0 lb

## 2021-01-14 DIAGNOSIS — G8929 Other chronic pain: Secondary | ICD-10-CM | POA: Diagnosis not present

## 2021-01-14 DIAGNOSIS — M545 Low back pain, unspecified: Secondary | ICD-10-CM | POA: Diagnosis not present

## 2021-01-14 NOTE — Progress Notes (Signed)
Office Visit Note   Patient: Joy Taylor           Date of Birth: 03-29-1996           MRN: [id number] Visit Date: 01/14/2021              Requested by: Martinique, Betty G, MD [address] [place],  [place] [address] PCP: Martinique, Betty G, MD   Assessment & Plan: Visit Diagnoses: Neck and low back pain with normal cervical and lumbar MRI.  Plan: Went over MRI cervical and lumbar.  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  We recommended a walking program, exercise gradual increase activity levels.  Follow-Up Instructions: No follow-ups on file.   Orders:  No orders of the defined types were placed in this encounter.  No orders of the defined types were placed in this encounter.     Procedures: No procedures performed   Clinical Data: No additional findings.   Subjective: Chief Complaint  Patient presents with  . Neck - Pain  . Lower Back - Pain    HPI 25 year old female with problems with neck pain back pain.  Patient states she was injured in the [employer] 2004 with intermittent flare since that time.  Patient had steroid injections without much relief.  Bilateral hand numbness that wakes her up at night she has splints that she is used.  She has pain in her back that radiates down the right leg with some tingling.  Feet go numb at times if she stands too long.  She is used Celebrex without relief.  She has been through physical therapy multiple rounds without resolution.  She has been on different anti-inflammatories in the past.  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  She not sure if she had any genetic testing.  She is to be active in gymnastics and is very flexible.  Review of Systems patient reports diagnosis of rheumatoid arthritis and fibromyalgia migraines.  Neck and back pain.  Non-smoker.  All other systems noncontributory other than as mentioned in  HPI.   Objective: Vital Signs: BP 122/80   Pulse (!) 108   Ht 5\' 5"  (1.651 m)   Wt 235 lb (106.6 kg)   BMI 39.11 kg/m   Physical Exam Constitutional:      Appearance: She is well-developed.  HENT:     Head: Normocephalic.     Right Ear: External ear normal.     Left Ear: External ear normal.  Eyes:     Pupils: Pupils are equal, round, and reactive to light.  Neck:     Thyroid: No thyromegaly.     Trachea: No tracheal deviation.  Cardiovascular:     Rate and Rhythm: Normal rate.  Pulmonary:     Effort: Pulmonary effort is normal.  Abdominal:     Palpations: Abdomen is soft.  Skin:    General: Skin is warm and dry.  Neurological:     Mental Status: She is alert and oriented to person, place, and time.  Psychiatric:        Mood and Affect: Mood and affect normal.        Behavior: Behavior normal.     Ortho Exam upper lower extremity reflexes are 1-2+ and symmetrical.  Negative straight leg raising.  She is able to heel and toe walk.  Specialty Comments:  No specialty comments available.  Imaging: CLINICAL DATA:  Low back pain. Progressive neurologic deficit. Bilateral hip and leg pain.  EXAM: MRI LUMBAR SPINE WITHOUT CONTRAST  TECHNIQUE: Multiplanar, multisequence MR imaging of the lumbar spine was performed. No intravenous contrast was administered.  COMPARISON:  Lumbar spine radiographs 01/30/2018  FINDINGS: Segmentation:  Normal  Alignment:  Normal  Vertebrae:  Normal bone marrow.  Negative for fracture or mass.  Conus medullaris and cauda equina: Conus extends to the L1-2 level. Conus and cauda equina appear normal.  Paraspinal and other soft tissues: Negative for paraspinous mass, adenopathy, or soft tissue edema  Disc levels:  Normal disc spaces.  No disc protrusion or spinal stenosis.  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 Otherwise normal MRI lumbar spine.   Electronically Signed   By: Franchot Gallo M.D. CLINICAL DATA:  Chronic neck pain. Numbness and weakness both hands.  EXAM: MRI CERVICAL SPINE WITHOUT CONTRAST  TECHNIQUE: Multiplanar, multisequence MR imaging of the cervical spine was performed. No intravenous contrast was administered.  COMPARISON:  Cervical spine radiographs 11/28/2019  FINDINGS: Alignment: Normal  Vertebrae: Normal bone marrow.  Negative for fracture or mass.  Cord: Normal signal and morphology.  Posterior Fossa, vertebral arteries, paraspinal tissues: Negative for paraspinous mass or soft tissue edema.  Disc levels:  Normal disc spaces.  No disc protrusion or stenosis identified.  IMPRESSION: Negative MRI cervical spine.   Electronically Signed   By: Franchot Gallo M.D.   On: 12/10/2020 12:43  PMFS History: Patient Active Problem List   Diagnosis Date Noted  . Cervicalgia 11/06/2020  . Chronic lower back pain 11/06/2020  . Severe obesity (BMI 35.0-39.9) with comorbidity ([place]) 11/06/2020  . Vitamin D deficiency 05/07/2020  . Palpitations 04/26/2020  . Thyroid nodule 02/19/2020  . Tremor 02/19/2020  . Ehlers-Danlos disease 04/19/2017  . Rheumatoid arthritis ([place]) 04/19/2017  . Systemic lupus erythematosus ([place]) 04/19/2017  . Fibromyalgia 04/19/2017  . Migraine 04/19/2017   Past Medical History:  Diagnosis Date  . Anxiety   . Depression   . Ehlers-Danlos disease   . Fibromyalgia   . Genital herpes    diagnosed by GYN-per patient no treatment advised unless daily flare ups occur  . GERD (gastroesophageal reflux disease)   . IBS (irritable bowel syndrome)   . Kidney stones   . Low BP    post-op  . Lupus ([place])   . Migraine   . Rheumatoid arthritis ([place])   . Stomach problems     Family History  Problem Relation Age of Onset  . Diabetes Mother   . Diverticulitis Mother   . Colon polyps Mother   . Macular  degeneration Maternal Grandmother   . Heart disease Maternal Grandmother   . Hypertension Maternal Grandfather   . Heart disease Maternal Grandfather   . Bone cancer Father   . Asthma Other   . Obesity Other   . Heart disease Other   . Thyroid cancer Other        uncertain cell type.  several distant relatives--all deceased  . Colon cancer Neg Hx   . Esophageal cancer Neg Hx   . Pancreatic cancer Neg Hx   . Stomach cancer Neg Hx   . Liver disease Neg Hx   . Rectal cancer Neg Hx     Past Surgical History:  Procedure Laterality Date  . HIP ARTHROSCOPY Right 07/15/2016, 08/06/2015  .  KNEE ARTHROSCOPY Right   . NISSEN FUNDOPLICATION  41/42/3953  . ROTATOR CUFF REPAIR Right 2010  . SHOULDER ARTHROSCOPY Right 06/29/2018   Social History   Occupational History  . Occupation: Optometrist  Tobacco Use  . Smoking status: Never Smoker  . Smokeless tobacco: Never Used  Vaping Use  . Vaping Use: Never used  Substance and Sexual Activity  . Alcohol use: Yes    Comment: rare-occ. per pt  . Drug use: No  . Sexual activity: Yes

## 2021-01-14 NOTE — Telephone Encounter (Signed)
Late documentation. 01/13/21 Patient sent a MyChart message on 01/13/2021 stating that she was unable to pick up her Aimovig at the pharmacy and had been without her medication for several weeks.  Patients Aimovig was approved on 12/23/2020 for one year.  I called patient's local pharmacy and spoke with pharmacist, pharmacy did not have pt's correct insurance on file for patient once the correct insurance was provided the claim for Aimovig went through.  I called patient back and advised that pharmacy did not have her correct insurance on file and that they would process her order and let her know when she could pick Aimovig up.  Patient was advised to send a MyChart message back to call office back if she had further issues with taking this medication.  Patient verbalized understanding and had no further questions.
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
## 2021-01-16 ENCOUNTER — Ambulatory Visit (INDEPENDENT_AMBULATORY_CARE_PROVIDER_SITE_OTHER): Payer: 59 | Admitting: Neurology

## 2021-01-16 ENCOUNTER — Encounter: Payer: Self-pay | Admitting: Neurology

## 2021-01-16 VITALS — BP 109/75 | HR 72 | Ht 65.0 in | Wt 238.0 lb

## 2021-01-16 DIAGNOSIS — M797 Fibromyalgia: Secondary | ICD-10-CM | POA: Diagnosis not present

## 2021-01-16 DIAGNOSIS — G43809 Other migraine, not intractable, without status migrainosus: Secondary | ICD-10-CM

## 2021-01-16 MED ORDER — GABAPENTIN 600 MG PO TABS: 600.0000 mg | ORAL_TABLET | Freq: Three times a day (TID) | ORAL | 11 refills | Status: AC

## 2021-01-16 MED ORDER — AIMOVIG 70 MG/ML ~~LOC~~ SOAJ: 70.0000 mg | SUBCUTANEOUS | 11 refills | Status: DC

## 2021-01-16 NOTE — Progress Notes (Signed)
I have read the note, and I agree with the clinical assessment and plan.  Charles K Willis   

## 2021-01-16 NOTE — Patient Instructions (Signed)
Continue current medications Continue to see your primary doctor See you back in 1 year  

## 2021-04-29 ENCOUNTER — Ambulatory Visit: Payer: Self-pay | Admitting: Neurology

## 2021-07-02 ENCOUNTER — Other Ambulatory Visit: Payer: Self-pay

## 2021-07-03 ENCOUNTER — Encounter: Payer: Self-pay | Admitting: Family Medicine

## 2021-07-03 ENCOUNTER — Ambulatory Visit (INDEPENDENT_AMBULATORY_CARE_PROVIDER_SITE_OTHER): Payer: 59 | Admitting: Family Medicine

## 2021-07-03 VITALS — BP 108/72 | HR 78 | Temp 98.2°F | Wt 240.2 lb

## 2021-07-03 DIAGNOSIS — L739 Follicular disorder, unspecified: Secondary | ICD-10-CM

## 2021-07-03 MED ORDER — KETOCONAZOLE 2 % EX CREA: 1.0000 "application " | TOPICAL_CREAM | Freq: Every day | CUTANEOUS | 0 refills | Status: AC | PRN

## 2021-07-03 NOTE — Progress Notes (Signed)
Subjective:    Patient ID: Joy Taylor, female    DOB: 08-22-1996, 25 y.o.   MRN: [id number]  Chief Complaint  Patient presents with   Rash    May be ingrown hairs, but not sure. Works out a lot and sweats a lot    HPI Patient was seen today for acute concern.  Patient endorses several red bumps on vaginal area and groin that have appeared intermittently over the last several months.  At times uncomfortable.  Patient was unsure if they were ingrown hair.  Stopped shaving due the the bumps.  Endorses increased sweating due to exercising but showering/changing clothes immediately after.  Also concerned about possibility of HSV.  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 Asymptomatic.  Currently on menses.  Past Medical History:  Diagnosis Date   Anxiety    Depression    Ehlers-Danlos disease    Fibromyalgia    Genital herpes    diagnosed by GYN-per patient no treatment advised unless daily flare ups occur   GERD (gastroesophageal reflux disease)    IBS (irritable bowel syndrome)    Kidney stones    Low BP    post-op   Lupus ([facility])    Migraine    Rheumatoid arthritis ([facility])    Stomach problems     Allergies  Allergen Reactions   Sumatriptan     Other reaction(s): Serotonin syndrome    ROS General: Denies fever, chills, night sweats, changes in weight, changes in appetite HEENT: Denies headaches, ear pain, changes in vision, rhinorrhea, sore throat CV: Denies CP, palpitations, SOB, orthopnea Pulm: Denies SOB, cough, wheezing GI: Denies abdominal pain, nausea, vomiting, diarrhea, constipation GU: Denies dysuria, hematuria, frequency, vaginal discharge Msk: Denies muscle cramps, joint pains Neuro: Denies weakness, numbness, tingling Skin: Denies rashes, bruising + rash in groin Psych: Denies depression, anxiety, hallucinations    Objective:    Blood pressure 108/72, pulse 78, temperature 98.2 F (36.8 C), temperature source Oral, weight  240 lb 3.2 oz (109 kg), SpO2 99 %.  Gen. Pleasant, well-nourished, in no distress, normal affect   HEENT: [place]/AT, face symmetric, conjunctiva clear, no scleral icterus, PERRLA, EOMI, nares patent without drainage Lungs: no accessory muscle use Cardiovascular: RRR, no peripheral edema GU: Normal external female genitalia.  Several small erythematous papules surrounding hair follicles noted.  No pustules or clusters of vesicles. Musculoskeletal: No deformities, no cyanosis or clubbing, normal tone Neuro:  A&Ox3, CN II-XII intact, normal gait Skin:  Warm, dry, intact.  Several erythematous papules surrounding a hair follicle on mons pubis, bilateral groin, and L labia majora.  No pustules or clusters of vesicles.   Wt Readings from Last 3 Encounters:  07/03/21 240 lb 3.2 oz (109 kg)  01/16/21 238 lb (108 kg)  01/14/21 235 lb (106.6 kg)    Lab Results  Component Value Date   WBC 8.9 08/30/2020   HGB 12.0 08/30/2020   HCT 35.9 (L) 08/30/2020   PLT 301.0 08/30/2020   GLUCOSE 91 04/29/2020   ALT 12 08/16/2019   AST 16 08/16/2019   NA 135 04/29/2020   K 4.0 04/29/2020   CL 102 04/29/2020   CREATININE 0.86 04/29/2020   BUN 15 04/29/2020   CO2 27 04/29/2020   TSH 2.27 06/04/2020   HGBA1C 5.3 11/06/2020    Assessment/Plan:  Folliculitis  -discussed supportive care including gentle soaps and lotions.  Continue changing out out of work out clothes immediately after exercising -Consider Hibiclens - Plan:  ketoconazole (NIZORAL) 2 % cream  F/u prn  Grier Mitts, MD

## 2021-08-25 ENCOUNTER — Encounter: Payer: Self-pay | Admitting: Neurology

## 2021-08-25 ENCOUNTER — Encounter: Payer: Self-pay | Admitting: Family Medicine

## 2021-08-25 ENCOUNTER — Encounter: Payer: 59 | Admitting: Certified Nurse Midwife

## 2021-08-26 ENCOUNTER — Telehealth (INDEPENDENT_AMBULATORY_CARE_PROVIDER_SITE_OTHER): Payer: 59 | Admitting: Family Medicine

## 2021-08-26 ENCOUNTER — Encounter: Payer: Self-pay | Admitting: Family Medicine

## 2021-08-26 VITALS — Wt 227.0 lb

## 2021-08-26 DIAGNOSIS — U071 COVID-19: Secondary | ICD-10-CM

## 2021-08-26 MED ORDER — ALBUTEROL SULFATE HFA 108 (90 BASE) MCG/ACT IN AERS: 2.0000 | INHALATION_SPRAY | Freq: Four times a day (QID) | RESPIRATORY_TRACT | 0 refills | Status: DC | PRN

## 2021-08-26 MED ORDER — BENZONATATE 200 MG PO CAPS: 200.0000 mg | ORAL_CAPSULE | Freq: Two times a day (BID) | ORAL | 0 refills | Status: DC | PRN

## 2021-08-26 NOTE — Patient Instructions (Addendum)
   ---------------------------------------------------------------------------------------------------------------------------      WORK SLIP:  Patient Joy Taylor,  [birth year], was seen for a medical visit today, 08/26/21 . Please excuse from work for a COVID like illness. We advise 10 days minimum from the onset of symptoms (08/20/21) PLUS 1 day of no fever and improved symptoms. Will defer to employer for a sooner return to work if symptoms have resolved, it is greater than 5 days since the positive test and the patient can wear a high-quality, tight fitting mask such as N95 or KN95 at all times for an additional 5 days. Would also suggest COVID19 antigen testing is negative prior to return.  Sincerely: E-signature: Dr. Colin Benton, DO [facility] Ph: [phone]   ------------------------------------------------------------------------------------------------------------------------------    HOME CARE TIPS:  -I sent the medication(s) we discussed to your pharmacy: Meds ordered this encounter  Medications   benzonatate (TESSALON) 200 MG capsule    Sig: Take 1 capsule (200 mg total) by mouth 2 (two) times daily as needed.    Dispense:  20 capsule    Refill:  0    -can use tylenol  if needed for fevers, aches and pains per instructions  -can use nasal saline a few times per day if you have nasal congestion  -stay hydrated, drink plenty of fluids and eat small healthy meals - avoid dairy  -can take 1000 IU (22mcg) Vit D3 and 100-500 mg of Vit C daily per instructions  -follow up with your doctor in 2-3 days unless improving and feeling better  -stay home while sick, except to seek medical care. If you have COVID19, ideally it would be best to stay home for a full 10 days since the onset of symptoms PLUS one day of no fever and feeling better. Wear a good mask that fits snugly (such as N95 or KN95) if around others to reduce the risk of transmission.  It  was nice to meet you today, and I really hope you are feeling better soon. I help [facility] out with telemedicine visits on Tuesdays and Thursdays and am available for visits on those days. If you have any concerns or questions following this visit please schedule a follow up visit with your Primary Care doctor or seek care at a local urgent care clinic to avoid delays in care.    Seek in person care or schedule a follow up video visit promptly if your symptoms worsen, new concerns arise or you are not improving with treatment. Call 911 and/or seek emergency care if your symptoms are severe or life threatening.

## 2021-08-26 NOTE — Telephone Encounter (Signed)
Spoke with the patient and informed her of the message below from Dr Maudie Mercury.  Patient complains of wheezing and a cough.  Patient is aware the Rx was sent to [facility] and to contact PCP with further concerns.

## 2021-08-26 NOTE — Progress Notes (Signed)
Virtual Visit via Video Note  I connected with Joy Taylor  on 08/26/21 at 11:00 AM EDT by a video enabled telemedicine application and verified that I am speaking with the correct person using two identifiers.  Location patient: home, [place] Location provider:work or home office Persons participating in the virtual visit: patient, provider  I discussed the limitations of evaluation and management by telemedicine and the availability of in person appointments. The patient expressed understanding and agreed to proceed.   HPI:  Acute telemedicine visit for Covid19: -Onset: about 6-7 days ago; tested positive for covid -Symptoms include: sinus congestion, cough, stuffy/runny nose, nausea, headache - mild, feels tired, mild diarrhea - better but still has cough -Denies: SOB (unless doing stairs), CP (only when coughing at times), vomiting, inability to eat/drink/get out of bed -Has tried: musinex, elderberry, Pedialyte,  -Pertinent past medical history:see below -Pertinent medication allergies:  Allergies  Allergen Reactions   Sumatriptan     Other reaction(s): Serotonin syndrome  -COVID-19 vaccine status: 2 doses and 2 boosters  ROS: See pertinent positives and negatives per HPI.  Past Medical History:  Diagnosis Date   Anxiety    Depression    Ehlers-Danlos disease    Fibromyalgia    Genital herpes    diagnosed by GYN-per patient no treatment advised unless daily flare ups occur   GERD (gastroesophageal reflux disease)    IBS (irritable bowel syndrome)    Kidney stones    Low BP    post-op   Lupus ([facility])    Migraine    Rheumatoid arthritis ([place])    Stomach problems     Past Surgical History:  Procedure Laterality Date   HIP ARTHROSCOPY Right 07/15/2016, 08/06/2015   KNEE ARTHROSCOPY Right    NISSEN FUNDOPLICATION  01/75/1025   ROTATOR CUFF REPAIR Right 2010   SHOULDER ARTHROSCOPY Right 06/29/2018     Current Outpatient Medications:    aspirin-acetaminophen-caffeine  (EXCEDRIN MIGRAINE) 250-250-65 MG tablet, Take 2 tablets by mouth every 6 (six) hours as needed for headache. , Disp: , Rfl:    AUROVELA 1.5/30 1.5-30 MG-MCG tablet, TAKE 1 TABLET BY MOUTH EVERY DAY, Disp: 63 tablet, Rfl: 3   benzonatate (TESSALON) 200 MG capsule, Take 1 capsule (200 mg total) by mouth 2 (two) times daily as needed., Disp: 20 capsule, Rfl: 0   Erenumab-aooe (AIMOVIG) 70 MG/ML SOAJ, Inject 70 mg into the skin every 30 (thirty) days., Disp: 1 mL, Rfl: 11   escitalopram (LEXAPRO) 10 MG tablet, Take 10 mg by mouth daily., Disp: , Rfl:    fluticasone (FLONASE) 50 MCG/ACT nasal spray, Place 2 sprays into both nostrils as needed (tries to take every other day). , Disp: , Rfl:    gabapentin (NEURONTIN) 600 MG tablet, Take 1 tablet (600 mg total) by mouth 3 (three) times daily., Disp: 90 tablet, Rfl: 11   glucose monitoring kit (FREESTYLE) monitoring kit, 1 each by Does not apply route as needed for other., Disp: 1 each, Rfl: 0   ketoconazole (NIZORAL) 2 % cream, Apply 1 application topically daily as needed for irritation., Disp: 30 g, Rfl: 0   levothyroxine (SYNTHROID) 25 MCG tablet, Take 1 tablet (25 mcg total) by mouth daily before breakfast., Disp: 90 tablet, Rfl: 3   MELATONIN PO, Take 2 tablets by mouth as needed., Disp: , Rfl:    ondansetron (ZOFRAN ODT) 4 MG disintegrating tablet, Take 1 tablet (4 mg total) by mouth every 6 (six) hours as needed for nausea or vomiting., Disp: 30 tablet,  Rfl: 3   RITUXIMAB-ARRX IV, Inject into the vein., Disp: , Rfl:    SYSTANE BALANCE 0.6 % SOLN, daily. , Disp: , Rfl:    tiZANidine (ZANAFLEX) 4 MG tablet, Take 1 tablet (4 mg total) by mouth every 8 (eight) hours as needed for muscle spasms., Disp: 30 tablet, Rfl: 1   traZODone (DESYREL) 50 MG tablet, TAKE ONE TABLET BY MOUTH AT BEDTIME FOR SLEEP AND MOOD, Disp: , Rfl:   EXAM:  VITALS per patient if applicable:  GENERAL: alert, oriented, appears well and in no acute distress  HEENT: atraumatic,  conjunttiva clear, no obvious abnormalities on inspection of external nose and ears  NECK: normal movements of the head and neck  LUNGS: on inspection no signs of respiratory distress, breathing rate appears normal, no obvious gross SOB, gasping or wheezing  CV: no obvious cyanosis  MS: moves all visible extremities without noticeable abnormality  PSYCH/NEURO: pleasant and cooperative, no obvious depression or anxiety, speech and thought processing grossly intact  ASSESSMENT AND PLAN:  Discussed the following assessment and plan:  COVID-19  -we discussed possible serious and likely etiologies, options for evaluation and workup, limitations of telemedicine visit vs in person visit, treatment, treatment risks and precautions. Pt is agreeable to treatment via telemedicine at this moment. Unfortunately is out of treatment window for antiviral treatment. Opted for Tessalon for cough and symptomatic care per patient instructions.  Work/School slipped offered: provided in patient instructions   Advised to seek prompt in person care if worsening, new symptoms arise, or if is not improving with treatment. Discussed options for inperson care if PCP office not available. Did let this patient know that I only do telemedicine on Tuesdays and Thursdays for [place]. Advised to schedule follow up visit with PCP or [facility] if any further questions or concerns to avoid delays in care.   I discussed the assessment and treatment plan with the patient. The patient was provided an opportunity to ask questions and all were answered. The patient agreed with the plan and demonstrated an understanding of the instructions.     Joy Kern, DO

## 2021-08-27 ENCOUNTER — Telehealth: Payer: Self-pay

## 2021-08-27 NOTE — Telephone Encounter (Signed)
Received a PA request from [facility]. Completed PA via CMM. Sent to [employer].   Key: [id number]  [id number]:[id number];Review Type:Prior Auth;Coverage Start Date:07/28/2021;Coverage End Date:08/27/2022.

## 2021-09-08 ENCOUNTER — Other Ambulatory Visit: Payer: Self-pay

## 2021-09-08 MED ORDER — AIMOVIG 70 MG/ML ~~LOC~~ SOAJ: 70.0000 mg | SUBCUTANEOUS | 11 refills | Status: AC

## 2021-09-30 NOTE — Patient Instructions (Signed)
Breast Self-Awareness Breast self-awareness is knowing how your breasts look and feel. Doing breast self-awareness is important. It allows you to catch a breast problem early while it is still small and can be treated. All women should do breast self-awareness, including women who have had breast implants. Tell your doctor if you notice a change in your breasts. What you need: A mirror. A well-lit room. How to do a breast self-exam A breast self-exam is one way to learn what is normal for your breasts and to check for changes. To do a breast self-exam: Look for changes  Take off all the clothes above your waist. Stand in front of a mirror in a room with good lighting. Put your hands on your hips. Push your hands down. Look at your breasts and nipples in the mirror to see if one breast or nipple looks different from the other. Check to see if: The shape of one breast is different. The size of one breast is different. There are wrinkles, dips, and bumps in one breast and not the other. Look at each breast for changes in the skin, such as: Redness. Scaly areas. Look for changes in your nipples, such as: Liquid around the nipples. Bleeding. Dimpling. Redness. A change in where the nipples are. Feel for changes  Lie on your back on the floor. Feel each breast. To do this, follow these steps: Pick a breast to feel. Put the arm closest to that breast above your head. Use your other arm to feel the nipple area of your breast. Feel the area with the pads of your three middle fingers by making small circles with your fingers. For the first circle, press lightly. For the second circle, press harder. For the third circle, press even harder. Keep making circles with your fingers at the different pressures as you move down your breast. Stop when you feel your ribs. Move your fingers a little toward the center of your body. Start making circles with your fingers again, this time going up until  you reach your collarbone. Keep making up-and-down circles until you reach your armpit. Remember to keep using the three pressures. Feel the other breast in the same way. Sit or stand in the tub or shower. With soapy water on your skin, feel each breast the same way you did in step 2 when you were lying on the floor. Write down what you find Writing down what you find can help you remember what to tell your doctor. Write down: What is normal for each breast. Any changes you find in each breast, including: The kind of changes you find. Whether you have pain. Size and location of any lumps. When you last had your menstrual period. General tips Check your breasts every month. If you are breastfeeding, the best time to check your breasts is after you feed your baby or after you use a breast pump. If you get menstrual periods, the best time to check your breasts is 5-7 days after your menstrual period is over. With time, you will become comfortable with the self-exam, and you will begin to know if there are changes in your breasts. Contact a doctor if you: See a change in the shape or size of your breasts or nipples. See a change in the skin of your breast or nipples, such as red or scaly skin. Have fluid coming from your nipples that is not normal. Find a lump or thick area that was not there before. Have pain in   your breasts. °Have any concerns about your breast health. °Summary °Breast self-awareness includes looking for changes in your breasts, as well as feeling for changes within your breasts. °Breast self-awareness should be done in front of a mirror in a well-lit room. °You should check your breasts every month. If you get menstrual periods, the best time to check your breasts is 5-7 days after your menstrual period is over. °Let your doctor know of any changes you see in your breasts, including changes in size, changes on the skin, pain or tenderness, or fluid from your nipples that is not  normal. °This information is not intended to replace advice given to you by your health care provider. Make sure you discuss any questions you have with your health care provider. °Document Revised: 06/21/2018 Document Reviewed: 06/21/2018 °Elsevier Patient Education © 2022 [employer] °Preventive Care 21-39 Years Old, Female °Preventive care refers to lifestyle choices and visits with your health care provider that can promote health and wellness. Preventive care visits are also called wellness exams. °What can I expect for my preventive care visit? °Counseling °During your preventive care visit, your health care provider may ask about your: °Medical history, including: °Past medical problems. °Family medical history. °Pregnancy history. °Current health, including: °Menstrual cycle. °Method of birth control. °Emotional well-being. °Home life and relationship well-being. °Sexual activity and sexual health. °Lifestyle, including: °Alcohol, nicotine or tobacco, and drug use. °Access to firearms. °Diet, exercise, and sleep habits. °Work and work environment. °Sunscreen use. °Safety issues such as seatbelt and bike helmet use. °Physical exam °Your health care provider may check your: °Height and weight. These may be used to calculate your BMI (body mass index). BMI is a measurement that tells if you are at a healthy weight. °Waist circumference. This measures the distance around your waistline. This measurement also tells if you are at a healthy weight and may help predict your risk of certain diseases, such as type 2 diabetes and high blood pressure. °Heart rate and blood pressure. °Body temperature. °Skin for abnormal spots. °What immunizations do I need? °Vaccines are usually given at various ages, according to a schedule. Your health care provider will recommend vaccines for you based on your age, medical history, and lifestyle or other factors, such as travel or where you work. °What tests do I  need? °Screening °Your health care provider may recommend screening tests for certain conditions. This may include: °Pelvic exam and Pap test. °Lipid and cholesterol levels. °Diabetes screening. This is done by checking your blood sugar (glucose) after you have not eaten for a while (fasting). °Hepatitis B test. °Hepatitis C test. °HIV (human immunodeficiency virus) test. °STI (sexually transmitted infection) testing, if you are at risk. °BRCA-related cancer screening. This may be done if you have a family history of breast, ovarian, tubal, or peritoneal cancers. °Talk with your health care provider about your test results, treatment options, and if necessary, the need for more tests. °Follow these instructions at home: °Eating and drinking ° °Eat a healthy diet that includes fresh fruits and vegetables, whole grains, lean protein, and low-fat dairy products. °Take vitamin and mineral supplements as recommended by your health care provider. °Do not drink alcohol if: °Your health care provider tells you not to drink. °You are pregnant, may be pregnant, or are planning to become pregnant. °If you drink alcohol: °Limit how much you have to 0-1 drink a day. °Know how much alcohol is in your drink. In the [place] one drink equals one 12 oz   bottle of beer (355 mL), one 5 oz glass of wine (148 mL), or one 1½ oz glass of hard liquor (44 mL). °Lifestyle °Brush your teeth every morning and night with fluoride toothpaste. Floss one time each day. °Exercise for at least 30 minutes 5 or more days each week. °Do not use any products that contain nicotine or tobacco. These products include cigarettes, chewing tobacco, and vaping devices, such as e-cigarettes. If you need help quitting, ask your health care provider. °Do not use drugs. °If you are sexually active, practice safe sex. Use a condom or other form of protection to prevent STIs. °If you do not wish to become pregnant, use a form of birth control. If you plan to become  pregnant, see your health care provider for a prepregnancy visit. °Find healthy ways to manage stress, such as: °Meditation, yoga, or listening to music. °Journaling. °Talking to a trusted person. °Spending time with friends and family. °Minimize exposure to UV radiation to reduce your risk of skin cancer. °Safety °Always wear your seat belt while driving or riding in a vehicle. °Do not drive: °If you have been drinking alcohol. Do not ride with someone who has been drinking. °If you have been using any mind-altering substances or drugs. °While texting. °When you are tired or distracted. °Wear a helmet and other protective equipment during sports activities. °If you have firearms in your house, make sure you follow all gun safety procedures. °Seek help if you have been physically or sexually abused. °What's next? °Go to your health care provider once a year for an annual wellness visit. °Ask your health care provider how often you should have your eyes and teeth checked. °Stay up to date on all vaccines. °This information is not intended to replace advice given to you by your health care provider. Make sure you discuss any questions you have with your health care provider. °Document Revised: 04/30/2021 Document Reviewed: 04/30/2021 °Elsevier Patient Education © 2022 [employer] Inc. ° °

## 2021-10-01 ENCOUNTER — Other Ambulatory Visit: Payer: Self-pay

## 2021-10-01 ENCOUNTER — Other Ambulatory Visit (HOSPITAL_COMMUNITY)
Admission: RE | Admit: 2021-10-01 | Discharge: 2021-10-01 | Disposition: A | Discharge status: Home / Self Care | Payer: 59 | Source: Ambulatory Visit | Attending: Obstetrics and Gynecology | Admitting: Obstetrics and Gynecology

## 2021-10-01 ENCOUNTER — Encounter: Payer: Self-pay | Admitting: Obstetrics and Gynecology

## 2021-10-01 ENCOUNTER — Ambulatory Visit: Payer: 59 | Admitting: Obstetrics and Gynecology

## 2021-10-01 VITALS — BP 112/56 | HR 73 | Ht 65.0 in | Wt 218.6 lb

## 2021-10-01 DIAGNOSIS — R102 Pelvic and perineal pain: Secondary | ICD-10-CM

## 2021-10-01 DIAGNOSIS — Z01419 Encounter for gynecological examination (general) (routine) without abnormal findings: Secondary | ICD-10-CM

## 2021-10-01 MED ORDER — NORETHINDRONE ACET-ETHINYL EST 1.5-30 MG-MCG PO TABS: 1.0000 | ORAL_TABLET | Freq: Every day | ORAL | 3 refills | Status: AC

## 2021-10-01 NOTE — Progress Notes (Signed)
HPI:      [name] is a 25 y.o. G0P0000 who LMP was Patient's last menstrual period was 09/24/2021 (exact date).  Subjective:   She presents today for her annual examination.  She is having normal regular cycles on OCPs.  She describes her cycles as light.  She does complain of monthly pelvic pain usually a few days before her menses and during her menses.  She also complains of pain with intercourse and bleeding with intercourse. Her cyclic monthly pain is not disabling at this time. She denies bleeding or pain with bowel movements or urination. She has no specific concerns regarding STDs but does not mind being tested for them. 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  She reports that her PCP at 1 time said that she might have fibroids but no specific work-up was performed. Of significant note patient has a history of partial thyroidectomy for thyroid cancer.  She is currently taking Synthroid.    Hx: The following portions of the patient's history were reviewed and updated as appropriate:             She  has a past medical history of Anxiety, Depression, Ehlers-Danlos disease, Fibromyalgia, Genital herpes, GERD (gastroesophageal reflux disease), IBS (irritable bowel syndrome), Kidney stones, Low BP, Lupus ([place]), Migraine, Rheumatoid arthritis ([place]), Stomach problems, and Thyroid disease. She does not have any pertinent problems on file. She  has a past surgical history that includes Rotator cuff repair (Right, 2010); Hip arthroscopy (Right, 07/15/2016, 08/06/2015); Nissen fundoplication (19/16/6060); Knee arthroscopy (Right); Shoulder arthroscopy (Right, 06/29/2018); and Thyroidectomy, partial (03/20/2020). Her family history includes Asthma in an other family member; Bone cancer in her father; Colon polyps in her mother; Diabetes in her mother; Diverticulitis in her mother; Heart disease in her maternal  grandfather, maternal grandmother, and another family member; Hypertension in her maternal grandfather; Macular degeneration in her maternal grandmother; Obesity in an other family member; Thyroid cancer in an other family member. She  reports that she has never smoked. She has never used smokeless tobacco. She reports current alcohol use. She reports that she does not use drugs. She has a current medication list which includes the following prescription(s): aspirin-acetaminophen-caffeine, aurovela 1.5/30, aimovig, escitalopram, ferrous sulfate, fluticasone, gabapentin, glucose monitoring kit, ketoconazole, levothyroxine, melatonin, ondansetron, probiotic product, systane balance, tizanidine, trazodone, and rituximab-arrx. She is allergic to sumatriptan.       Review of Systems:  Review of Systems  Constitutional: Denied constitutional symptoms, night sweats, recent illness, fatigue, fever, insomnia and weight loss.  Eyes: Denied eye symptoms, eye pain, photophobia, vision change and visual disturbance.  Ears/Nose/Throat/Neck: Denied ear, nose, throat or neck symptoms, hearing loss, nasal discharge, sinus congestion and sore throat.  Cardiovascular: Denied cardiovascular symptoms, arrhythmia, chest pain/pressure, edema, exercise intolerance, orthopnea and palpitations.  Respiratory: Denied pulmonary symptoms, asthma, pleuritic pain, productive sputum, cough, dyspnea and wheezing.  Gastrointestinal: Denied, gastro-esophageal reflux, melena, nausea and vomiting.  Genitourinary: See HPI for additional information.  Musculoskeletal: Denied musculoskeletal symptoms, stiffness, swelling, muscle weakness and myalgia.  Dermatologic: Denied dermatology symptoms, rash and scar.  Neurologic: Denied neurology symptoms, dizziness, headache, neck pain and syncope.  Psychiatric: Denied psychiatric symptoms, anxiety and depression.  Endocrine: Denied endocrine symptoms including hot flashes and night sweats.    Meds:   Current Outpatient Medications on File Prior to Visit  Medication Sig Dispense Refill   aspirin-acetaminophen-caffeine (EXCEDRIN MIGRAINE) 250-250-65 MG tablet Take 2  tablets by mouth every 6 (six) hours as needed for headache.      AUROVELA 1.5/30 1.5-30 MG-MCG tablet TAKE 1 TABLET BY MOUTH EVERY DAY 63 tablet 3   Erenumab-aooe (AIMOVIG) 70 MG/ML SOAJ Inject 70 mg into the skin every 30 (thirty) days. 1 mL 11   escitalopram (LEXAPRO) 10 MG tablet Take 10 mg by mouth daily.     ferrous sulfate 325 (65 FE) MG EC tablet Take 325 mg by mouth 3 (three) times daily with meals.     fluticasone (FLONASE) 50 MCG/ACT nasal spray Place 2 sprays into both nostrils as needed (tries to take every other day).      gabapentin (NEURONTIN) 600 MG tablet Take 1 tablet (600 mg total) by mouth 3 (three) times daily. 90 tablet 11   glucose monitoring kit (FREESTYLE) monitoring kit 1 each by Does not apply route as needed for other. 1 each 0   ketoconazole (NIZORAL) 2 % cream Apply 1 application topically daily as needed for irritation. 30 g 0   levothyroxine (SYNTHROID) 25 MCG tablet Take 1 tablet (25 mcg total) by mouth daily before breakfast. (Patient taking differently: Take 50 mcg by mouth daily before breakfast.) 90 tablet 3   MELATONIN PO Take 2 tablets by mouth as needed.     ondansetron (ZOFRAN ODT) 4 MG disintegrating tablet Take 1 tablet (4 mg total) by mouth every 6 (six) hours as needed for nausea or vomiting. 30 tablet 3   Probiotic Product (PROBIOTIC-10 PO) Take by mouth.     SYSTANE BALANCE 0.6 % SOLN daily.      tiZANidine (ZANAFLEX) 4 MG tablet Take 1 tablet (4 mg total) by mouth every 8 (eight) hours as needed for muscle spasms. 30 tablet 1   traZODone (DESYREL) 50 MG tablet TAKE ONE TABLET BY MOUTH AT BEDTIME FOR SLEEP AND MOOD     RITUXIMAB-ARRX IV Inject into the vein.     No current facility-administered medications on file prior to visit.     Objective:     Vitals:    10/01/21 0735  BP: (!) 112/56  Pulse: 73    Filed Weights   10/01/21 0735  Weight: 218 lb 9.6 oz (99.2 kg)              Physical examination General NAD, Conversant  HEENT Atraumatic; Op clear with mmm.  Normo-cephalic. Pupils reactive. Anicteric sclerae  Thyroid/Neck Smooth without nodularity or enlargement. Normal ROM.  Neck Supple.  Skin No rashes, lesions or ulceration. Normal palpated skin turgor. No nodularity.  Breasts: No masses or discharge.  Symmetric.  No axillary adenopathy.  Lungs: Clear to auscultation.No rales or wheezes. Normal Respiratory effort, no retractions.  Heart: NSR.  No murmurs or rubs appreciated. No periferal edema  Abdomen: Soft.  Non-tender.  No masses.  No HSM. No hernia  Extremities: Moves all appropriately.  Normal ROM for age. No lymphadenopathy.  Neuro: Oriented to PPT.  Normal mood. Normal affect.     Pelvic:   Vulva: Normal appearance.  No lesions.  Vagina: No lesions or abnormalities noted.  Increased discharge  Support: Normal pelvic support.  Urethra No masses tenderness or scarring.  Meatus Normal size without lesions or prolapse.  Cervix: Normal appearance.  No lesions.  friable  Anus: Normal exam.  No lesions.  Perineum: Normal exam.  No lesions.        Bimanual   Uterus: Normal size.  Non-tender.  Mobile.  AV.  Adnexae: No masses.  Non-tender  to palpation.  Cul-de-sac: Negative for abnormality.     Assessment:    G0P0000 Patient Active Problem List   Diagnosis Date Noted   Cervicalgia 11/06/2020   Chronic lower back pain 11/06/2020   Severe obesity (BMI 35.0-39.9) with comorbidity ([place]) 11/06/2020   Vitamin D deficiency 05/07/2020   Palpitations 04/26/2020   Thyroid nodule 02/19/2020   Tremor 02/19/2020   Ehlers-Danlos disease 04/19/2017   Rheumatoid arthritis ([place]) 04/19/2017   Systemic lupus erythematosus ([place]) 04/19/2017   Fibromyalgia 04/19/2017   Migraine 04/19/2017     1. Encounter for well woman exam with  routine gynecological exam   2. Pelvic pain in female     The patient describes the pain as a pain that would be similarly found in endometriosis.  Postcoital bleeding likely from very friable cervix ?  OCPs  ?  STDs   Plan:            1.  Basic Screening Recommendations The basic screening recommendations for asymptomatic women were discussed with the patient during her visit.  The age-appropriate recommendations were discussed with her and the rational for the tests reviewed.  When I am informed by the patient that another primary care physician has previously obtained the age-appropriate tests and they are up-to-date, only outstanding tests are ordered and referrals given as necessary.  Abnormal results of tests will be discussed with her when all of her results are completed.  Routine preventative health maintenance measures emphasized: Exercise/Diet/Weight control, Tobacco Warnings, Alcohol/Substance use risks and Stress Management Pap performed-GC/CT performed 2.  Ultrasound for pelvic pain ordered  We have discussed the causes of a friable cervix and the causes of pelvic pain.  All questions were answered. Orders No orders of the defined types were placed in this encounter.   No orders of the defined types were placed in this encounter.         F/U  No follow-ups on file. I spent 12 additional minutes involved in the care of this patient specifically discussing her pelvic pain, postcoital bleeding and other pelvic symptoms.  I spent additional time preparing to see the patient by obtaining and reviewing her medical history (including labs, imaging tests and prior procedures), documenting clinical information in the electronic health record (EHR), counseling and coordinating care plans, writing and sending prescriptions, ordering tests or procedures and in direct communicating with the patient and medical staff discussing pertinent items from her history and physical exam.  Finis Bud, M.D. 10/01/2021 8:07 AM

## 2021-10-07 ENCOUNTER — Other Ambulatory Visit: Payer: Self-pay | Admitting: Obstetrics and Gynecology

## 2021-10-07 ENCOUNTER — Other Ambulatory Visit: Payer: Self-pay

## 2021-10-07 ENCOUNTER — Telehealth: Payer: Self-pay | Admitting: Obstetrics and Gynecology

## 2021-10-07 ENCOUNTER — Ambulatory Visit (INDEPENDENT_AMBULATORY_CARE_PROVIDER_SITE_OTHER): Payer: 59

## 2021-10-07 DIAGNOSIS — R102 Pelvic and perineal pain: Secondary | ICD-10-CM | POA: Diagnosis not present

## 2021-10-07 LAB — CYTOLOGY - PAP
Chlamydia: NEGATIVE
Comment: NEGATIVE
Comment: NEGATIVE
Comment: NORMAL
Diagnosis: NEGATIVE
Neisseria Gonorrhea: NEGATIVE
Trichomonas: NEGATIVE

## 2021-10-07 NOTE — Telephone Encounter (Signed)
Pt came in for U/S while checking out asked for results, I made her aware everyone was in clinic and someone can reach back out to her after reviewing results.

## 2021-10-08 NOTE — Telephone Encounter (Signed)
Called patient to give her results. No answer. LVM and sent mychart message.  CB

## 2021-10-14 ENCOUNTER — Telehealth: Payer: Self-pay

## 2021-10-14 NOTE — Telephone Encounter (Signed)
Pt is requesting u/s results. Pls advise.

## 2022-01-21 ENCOUNTER — Encounter: Payer: Self-pay | Admitting: Neurology

## 2022-01-21 ENCOUNTER — Ambulatory Visit: Payer: 59 | Admitting: Neurology
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
## 2022-10-09 ENCOUNTER — Encounter: Payer: Self-pay | Admitting: Obstetrics and Gynecology

## 2023-05-13 ENCOUNTER — Emergency Department (HOSPITAL_COMMUNITY)
Admission: EM | Admit: 2023-05-13 | Discharge: 2023-05-13 | Disposition: A | Discharge status: Home / Self Care | Payer: No Typology Code available for payment source | Attending: Emergency Medicine | Admitting: Emergency Medicine

## 2023-05-13 ENCOUNTER — Emergency Department (HOSPITAL_COMMUNITY): Payer: No Typology Code available for payment source

## 2023-05-13 ENCOUNTER — Other Ambulatory Visit: Payer: Self-pay

## 2023-05-13 DIAGNOSIS — R5383 Other fatigue: Secondary | ICD-10-CM | POA: Insufficient documentation

## 2023-05-13 DIAGNOSIS — R0602 Shortness of breath: Secondary | ICD-10-CM | POA: Diagnosis not present

## 2023-05-13 DIAGNOSIS — R61 Generalized hyperhidrosis: Secondary | ICD-10-CM | POA: Diagnosis not present

## 2023-05-13 DIAGNOSIS — R053 Chronic cough: Secondary | ICD-10-CM | POA: Diagnosis not present

## 2023-05-13 DIAGNOSIS — R059 Cough, unspecified: Secondary | ICD-10-CM | POA: Diagnosis present

## 2023-05-13 LAB — COMPREHENSIVE METABOLIC PANEL
ALT: 16 U/L (ref 0–44)
AST: 19 U/L (ref 15–41)
Albumin: 3.7 g/dL (ref 3.5–5.0)
Alkaline Phosphatase: 60 U/L (ref 38–126)
Anion gap: 10 (ref 5–15)
BUN: 13 mg/dL (ref 6–20)
CO2: 23 mmol/L (ref 22–32)
Calcium: 9 mg/dL (ref 8.9–10.3)
Chloride: 102 mmol/L (ref 98–111)
Creatinine, Ser: 0.83 mg/dL (ref 0.44–1.00)
GFR, Estimated: >60 mL/min (ref >60)
Glucose, Bld: 80 mg/dL (ref 70–99)
Potassium: 3.9 mmol/L (ref 3.5–5.1)
Sodium: 135 mmol/L (ref 135–145)
Total Bilirubin: 0.6 mg/dL (ref 0.3–1.2)
Total Protein: 7 g/dL (ref 6.5–8.1)

## 2023-05-13 LAB — CBC WITH DIFFERENTIAL/PLATELET
Abs Immature Granulocytes: 0.02 10*3/uL (ref 0.00–0.07)
Basophils Absolute: 0.1 10*3/uL (ref 0.0–0.1)
Basophils Relative: 1 %
Eosinophils Absolute: 0.1 10*3/uL (ref 0.0–0.5)
Eosinophils Relative: 1 %
HCT: 36.3 % (ref 36.0–46.0)
Hemoglobin: 11.8 g/dL — ABNORMAL LOW (ref 12.0–15.0)
Immature Granulocytes: 0 %
Lymphocytes Relative: 16 %
Lymphs Abs: 1.2 10*3/uL (ref 0.7–4.0)
MCH: 28.4 pg (ref 26.0–34.0)
MCHC: 32.5 g/dL (ref 30.0–36.0)
MCV: 87.3 fL (ref 80.0–100.0)
Monocytes Absolute: 0.8 10*3/uL (ref 0.1–1.0)
Monocytes Relative: 11 %
Neutro Abs: 5.4 10*3/uL (ref 1.7–7.7)
Neutrophils Relative %: 71 %
Platelets: 304 10*3/uL (ref 150–400)
RBC: 4.16 MIL/uL (ref 3.87–5.11)
RDW: 13.2 % (ref 11.5–15.5)
WBC: 7.5 10*3/uL (ref 4.0–10.5)
nRBC: 0 % (ref 0.0–0.2)

## 2023-05-13 MED ORDER — ONDANSETRON 4 MG PO TBDP
4.0000 mg | ORAL_TABLET | Freq: Once | ORAL | Status: AC
  Administered 2023-05-13: 4 mg via ORAL
  Filled 2023-05-13: qty 1

## 2023-05-13 MED ORDER — FLUORESCEIN SODIUM 1 MG OP STRP
1.0000 | ORAL_STRIP | Freq: Once | OPHTHALMIC | Status: AC
  Administered 2023-05-13: 1 via OPHTHALMIC
  Filled 2023-05-13: qty 1

## 2023-05-13 MED ORDER — TETRACAINE HCL 0.5 % OP SOLN
1.0000 [drp] | Freq: Once | OPHTHALMIC | Status: AC
  Administered 2023-05-13: 1 [drp] via OPHTHALMIC
  Filled 2023-05-13: qty 4

## 2023-05-13 NOTE — Discharge Instructions (Signed)
Joy Taylor:  Thank you for allowing [place] to take care of you today.  We hope you begin feeling better soon.  To-Do: Please follow-up with your primary doctor. Faint left perihilar opacity which could reflect an area of atelectasis or focal inflammation. No other acute findings. Please return to the Emergency Department or call 911 if you experience chest pain, shortness of breath, severe pain, severe fever, altered mental status, or have any reason to think that you need emergency medical care.  Thank you again.  Hope you feel better soon.  Department of Emergency Medicine [facility]
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
## 2023-05-13 NOTE — ED Notes (Signed)
Pt states her symptoms have resolve but she still don't feel normal at this time.

## 2023-05-19 ENCOUNTER — Emergency Department (HOSPITAL_COMMUNITY)
Admission: EM | Admit: 2023-05-19 | Discharge: 2023-05-19 | Disposition: A | Discharge status: Home / Self Care | Payer: No Typology Code available for payment source | Attending: Emergency Medicine | Admitting: Emergency Medicine

## 2023-05-19 ENCOUNTER — Encounter (HOSPITAL_COMMUNITY): Payer: Self-pay | Admitting: Emergency Medicine

## 2023-05-19 ENCOUNTER — Other Ambulatory Visit: Payer: Self-pay

## 2023-05-19 ENCOUNTER — Emergency Department (HOSPITAL_COMMUNITY): Payer: No Typology Code available for payment source

## 2023-05-19 DIAGNOSIS — R079 Chest pain, unspecified: Secondary | ICD-10-CM | POA: Diagnosis not present

## 2023-05-19 LAB — TROPONIN I (HIGH SENSITIVITY)
Troponin I (High Sensitivity): 3 ng/L (ref <18)
Troponin I (High Sensitivity): 3 ng/L (ref <18)

## 2023-05-19 LAB — CBC
HCT: 34.6 % — ABNORMAL LOW (ref 36.0–46.0)
Hemoglobin: 11.3 g/dL — ABNORMAL LOW (ref 12.0–15.0)
MCH: 28.5 pg (ref 26.0–34.0)
MCHC: 32.7 g/dL (ref 30.0–36.0)
MCV: 87.2 fL (ref 80.0–100.0)
Platelets: 305 10*3/uL (ref 150–400)
RBC: 3.97 MIL/uL (ref 3.87–5.11)
RDW: 13 % (ref 11.5–15.5)
WBC: 7.1 10*3/uL (ref 4.0–10.5)
nRBC: 0 % (ref 0.0–0.2)

## 2023-05-19 LAB — BASIC METABOLIC PANEL
Anion gap: 6 (ref 5–15)
BUN: 6 mg/dL (ref 6–20)
CO2: 24 mmol/L (ref 22–32)
Calcium: 8.6 mg/dL — ABNORMAL LOW (ref 8.9–10.3)
Chloride: 107 mmol/L (ref 98–111)
Creatinine, Ser: 0.79 mg/dL (ref 0.44–1.00)
GFR, Estimated: >60 mL/min (ref >60)
Glucose, Bld: 103 mg/dL — ABNORMAL HIGH (ref 70–99)
Potassium: 3.8 mmol/L (ref 3.5–5.1)
Sodium: 137 mmol/L (ref 135–145)

## 2023-05-19 MED ORDER — ONDANSETRON 8 MG PO TBDP: 8.0000 mg | ORAL_TABLET | Freq: Three times a day (TID) | ORAL | 0 refills | Status: AC | PRN

## 2023-05-19 MED ORDER — AZITHROMYCIN 250 MG PO TABS: 250.0000 mg | ORAL_TABLET | Freq: Every day | ORAL | 0 refills | Status: AC

## 2023-05-19 MED ORDER — LACTATED RINGERS IV BOLUS
1000.0000 mL | Freq: Once | INTRAVENOUS | Status: AC
  Administered 2023-05-19: 1000 mL via INTRAVENOUS

## 2023-05-19 MED ORDER — ACETAMINOPHEN 325 MG PO TABS
650.0000 mg | ORAL_TABLET | Freq: Once | ORAL | Status: AC
  Administered 2023-05-19: 650 mg via ORAL
  Filled 2023-05-19: qty 2

## 2023-05-19 MED ORDER — AMOXICILLIN 500 MG PO CAPS: 500.0000 mg | ORAL_CAPSULE | Freq: Three times a day (TID) | ORAL | 0 refills | Status: AC

## 2023-05-19 NOTE — Discharge Instructions (Signed)
The test today were reassuring.  No signs of heart injury.  The chest x-ray did show the possibility of a small pneumonia.  Start taking the antibiotics as prescribed.  Follow-up with your primary care doctor and considering a cardiologist at the [facility] for further evaluation
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
## 2023-05-19 NOTE — ED Triage Notes (Signed)
Chest pain x 6 days. Has been seen by Urgent care this week [facility] per [facility] trop at [facility] was .16 and was advised to come to ER for further evaluation. Pt states pain is worse with exertion. Current pain is in bilateral lower extremity  20g RAC  400cc normal saline given 3 nitroglycerin

## 2023-11-17 ENCOUNTER — Emergency Department
Admission: EM | Admit: 2023-11-17 | Discharge: 2023-11-17 | Discharge status: Against Medical Advice | Payer: Self-pay | Attending: Emergency Medicine | Admitting: Emergency Medicine

## 2023-11-17 ENCOUNTER — Encounter: Payer: Self-pay | Admitting: *Deleted

## 2023-11-17 ENCOUNTER — Other Ambulatory Visit: Payer: Self-pay

## 2023-11-17 DIAGNOSIS — K625 Hemorrhage of anus and rectum: Secondary | ICD-10-CM | POA: Insufficient documentation

## 2023-11-17 DIAGNOSIS — R519 Headache, unspecified: Secondary | ICD-10-CM | POA: Insufficient documentation

## 2023-11-17 DIAGNOSIS — R110 Nausea: Secondary | ICD-10-CM | POA: Insufficient documentation

## 2023-11-17 DIAGNOSIS — Z5321 Procedure and treatment not carried out due to patient leaving prior to being seen by health care provider: Secondary | ICD-10-CM | POA: Insufficient documentation

## 2023-11-17 LAB — COMPREHENSIVE METABOLIC PANEL
ALT: 18 U/L (ref 0–44)
AST: 25 U/L (ref 15–41)
Albumin: 4.5 g/dL (ref 3.5–5.0)
Alkaline Phosphatase: 38 U/L (ref 38–126)
Anion gap: 14 (ref 5–15)
BUN: 18 mg/dL (ref 6–20)
CO2: 23 mmol/L (ref 22–32)
Calcium: 9.1 mg/dL (ref 8.9–10.3)
Chloride: 105 mmol/L (ref 98–111)
Creatinine, Ser: 0.8 mg/dL (ref 0.44–1.00)
GFR, Estimated: >60 mL/min (ref >60)
Glucose, Bld: 89 mg/dL (ref 70–99)
Potassium: 3.6 mmol/L (ref 3.5–5.1)
Sodium: 142 mmol/L (ref 135–145)
Total Bilirubin: 0.7 mg/dL (ref 0.0–1.2)
Total Protein: 7.6 g/dL (ref 6.5–8.1)

## 2023-11-17 LAB — URINE DRUG SCREEN, QUALITATIVE (ARMC ONLY)
Amphetamines, Ur Screen: NOT DETECTED
Barbiturates, Ur Screen: NOT DETECTED
Benzodiazepine, Ur Scrn: NOT DETECTED
Cannabinoid 50 Ng, Ur ~~LOC~~: NOT DETECTED
Cocaine Metabolite,Ur ~~LOC~~: NOT DETECTED
MDMA (Ecstasy)Ur Screen: NOT DETECTED
Methadone Scn, Ur: NOT DETECTED
Opiate, Ur Screen: NOT DETECTED
Phencyclidine (PCP) Ur S: NOT DETECTED
Tricyclic, Ur Screen: NOT DETECTED

## 2023-11-17 LAB — PREGNANCY, URINE: Preg Test, Ur: NEGATIVE

## 2023-11-17 LAB — CBC WITH DIFFERENTIAL/PLATELET
Abs Immature Granulocytes: 0.04 10*3/uL (ref 0.00–0.07)
Basophils Absolute: 0.1 10*3/uL (ref 0.0–0.1)
Basophils Relative: 1 %
Eosinophils Absolute: 0.1 10*3/uL (ref 0.0–0.5)
Eosinophils Relative: 1 %
HCT: 39.7 % (ref 36.0–46.0)
Hemoglobin: 13.5 g/dL (ref 12.0–15.0)
Immature Granulocytes: 0 %
Lymphocytes Relative: 23 %
Lymphs Abs: 2.1 10*3/uL (ref 0.7–4.0)
MCH: 29 pg (ref 26.0–34.0)
MCHC: 34 g/dL (ref 30.0–36.0)
MCV: 85.2 fL (ref 80.0–100.0)
Monocytes Absolute: 0.8 10*3/uL (ref 0.1–1.0)
Monocytes Relative: 9 %
Neutro Abs: 5.9 10*3/uL (ref 1.7–7.7)
Neutrophils Relative %: 66 %
Platelets: 362 10*3/uL (ref 150–400)
RBC: 4.66 MIL/uL (ref 3.87–5.11)
RDW: 14.7 % (ref 11.5–15.5)
WBC: 9 10*3/uL (ref 4.0–10.5)
nRBC: 0 % (ref 0.0–0.2)

## 2023-11-17 LAB — URINALYSIS, ROUTINE W REFLEX MICROSCOPIC
Bilirubin Urine: NEGATIVE
Glucose, UA: NEGATIVE mg/dL
Ketones, ur: NEGATIVE mg/dL
Nitrite: NEGATIVE
Protein, ur: 30 mg/dL — AB
Specific Gravity, Urine: 1.03 (ref 1.005–1.030)
pH: 6 (ref 5.0–8.0)
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
## 2023-12-27 ENCOUNTER — Ambulatory Visit
Admission: RE | Admit: 2023-12-27 | Discharge: 2023-12-27 | Disposition: A | Discharge status: Home / Self Care | Payer: Non-veteran care | Source: Ambulatory Visit | Attending: Emergency Medicine | Admitting: Emergency Medicine

## 2023-12-27 VITALS — BP 116/77 | HR 87 | Temp 97.8°F | Resp 16

## 2023-12-27 DIAGNOSIS — J069 Acute upper respiratory infection, unspecified: Secondary | ICD-10-CM

## 2023-12-27 MED ORDER — PREDNISONE 20 MG PO TABS: 40.0000 mg | ORAL_TABLET | Freq: Every day | ORAL | 0 refills | Status: DC

## 2023-12-27 MED ORDER — AMOXICILLIN-POT CLAVULANATE 875-125 MG PO TABS: 1.0000 | ORAL_TABLET | Freq: Two times a day (BID) | ORAL | 0 refills | Status: AC

## 2023-12-27 MED ORDER — ONDANSETRON 4 MG PO TBDP
4.0000 mg | ORAL_TABLET | Freq: Once | ORAL | Status: AC
  Administered 2023-12-27: 4 mg via ORAL

## 2023-12-27 NOTE — ED Triage Notes (Signed)
 Patient presents to UC for sore throat, productive cough, SOB, fatigue x 2 weeks. States she has been taking mucinex with no improvement. Started feeling nauseous today.

## 2023-12-27 NOTE — ED Provider Notes (Signed)
 [facility]    CSN: [id number] Arrival date & time: 12/27/23  1356      History   Chief Complaint Chief Complaint  Patient presents with   Sore Throat   Shortness of Breath    HPI Joy Taylor is a 28 y.o. female.   Patient presents for evaluation of chills, mild left-sided ear pain, nasal congestion, rhinorrhea, primarily nonproductive cough, shortness of breath with exertion, wheezing, headaches and nausea without vomiting present for 2 weeks.  Symptoms seem to be improving but began to worsen again 3 days ago.  Known sick contacts at work.  Poor appetite but able to tolerate food and liquids.  Has attempted use of Mucinex.  Past Medical History:  Diagnosis Date   Anxiety    Depression    Ehlers-Danlos disease    Fibromyalgia    Genital herpes    diagnosed by GYN-per patient no treatment advised unless daily flare ups occur   GERD (gastroesophageal reflux disease)    HSV-2 (herpes simplex virus 2) infection    IBS (irritable bowel syndrome)    Kidney stones    Low BP    post-op   Lupus    Migraine    Rheumatoid arthritis (HCC)    Stomach problems    Thyroid  disease     Patient Active Problem List   Diagnosis Date Noted   Cervicalgia 11/06/2020   Chronic lower back pain 11/06/2020   Severe obesity (BMI 35.0-39.9) with comorbidity (HCC) 11/06/2020   Vitamin D deficiency 05/07/2020   Palpitations 04/26/2020   Thyroid  nodule 02/19/2020   Tremor 02/19/2020   Ehlers-Danlos disease 04/19/2017   Rheumatoid arthritis (HCC) 04/19/2017   Systemic lupus erythematosus (HCC) 04/19/2017   Fibromyalgia 04/19/2017   Migraine 04/19/2017    Past Surgical History:  Procedure Laterality Date   HIP ARTHROSCOPY Right 07/15/2016, 08/06/2015   KNEE ARTHROSCOPY Right    NISSEN FUNDOPLICATION  11/26/2016   ROTATOR CUFF REPAIR Right 2010   SHOULDER ARTHROSCOPY Right 06/29/2018   THYROIDECTOMY, PARTIAL  03/20/2020    OB History     Gravida  0   Para  0   Term   0   Preterm  0   AB  0   Living  0      SAB  0   IAB  0   Ectopic  0   Multiple  0   Live Births  0            Home Medications    Prior to Admission medications   Medication Sig Start Date End Date Taking? Authorizing Provider  amoxicillin -clavulanate (AUGMENTIN ) 875-125 MG tablet Take 1 tablet by mouth every 12 (twelve) hours for 10 days. 12/27/23 01/06/24 Yes Tais Koestner R, NP  predniSONE  (DELTASONE ) 20 MG tablet Take 2 tablets (40 mg total) by mouth daily. 12/27/23  Yes Pollie Poma R, NP  amoxicillin  (AMOXIL ) 500 MG capsule Take 1 capsule (500 mg total) by mouth 3 (three) times daily. 05/19/23   Trish Furl, MD  aspirin-acetaminophen -caffeine (EXCEDRIN MIGRAINE) 250-250-65 MG tablet Take 2 tablets by mouth every 6 (six) hours as needed for headache.     [provider]  azithromycin  (ZITHROMAX ) 250 MG tablet Take 1 tablet (250 mg total) by mouth daily. Take first 2 tablets together, then 1 every day until finished. 05/19/23   Trish Furl, MD  Erenumab -aooe (AIMOVIG ) 70 MG/ML SOAJ Inject 70 mg into the skin every 30 (thirty) days. 09/08/21   Wess Hammed, NP  escitalopram (LEXAPRO) 10 MG tablet Take 10 mg by mouth daily.    [provider]  ferrous sulfate 325 (65 FE) MG EC tablet Take 325 mg by mouth 3 (three) times daily with meals.    [provider]  fluticasone (FLONASE) 50 MCG/ACT nasal spray Place 2 sprays into both nostrils as needed for allergies or rhinitis (tries to take every other day). 03/03/19   [provider]  gabapentin  (NEURONTIN ) 600 MG tablet Take 1 tablet (600 mg total) by mouth 3 (three) times daily. 01/16/21   Wess Hammed, NP  glucose monitoring kit (FREESTYLE) monitoring kit 1 each by Does not apply route as needed for other. Patient taking differently: 1 each by Does not apply route as needed for other (testing). 01/25/19   [place], Betty G, MD  ketoconazole  (NIZORAL ) 2 % cream Apply 1 application topically  daily as needed for irritation. 07/03/21   Viola Greulich, MD  levothyroxine  (SYNTHROID ) 25 MCG tablet Take 1 tablet (25 mcg total) by mouth daily before breakfast. Patient taking differently: Take 50 mcg by mouth daily before breakfast. 05/07/20   Gwyndolyn Lerner, MD  MELATONIN PO Take 2 tablets by mouth as needed.    [provider]  Norethindrone  Acetate-Ethinyl Estradiol (AUROVELA 1.5/30) 1.5-30 MG-MCG tablet Take 1 tablet by mouth daily. 10/01/21 10/01/22  Zenobia Hila, MD  ondansetron  (ZOFRAN -ODT) 8 MG disintegrating tablet Take 1 tablet (8 mg total) by mouth every 8 (eight) hours as needed for nausea or vomiting. 05/19/23   Trish Furl, MD  Probiotic Product (PROBIOTIC-10 PO) Take 1 tablet by mouth daily.    [provider]  RITUXIMAB-ARRX IV Inject into the vein.    [provider]  SYSTANE BALANCE 0.6 % SOLN Apply 1 drop to eye daily. 08/25/18   [provider]  tiZANidine  (ZANAFLEX ) 4 MG tablet Take 1 tablet (4 mg total) by mouth every 8 (eight) hours as needed for muscle spasms. 11/28/19   [place], Betty G, MD  traZODone (DESYREL) 50 MG tablet Take 50 mg by mouth at bedtime as needed for sleep (mood). 06/25/21   [provider]    Family History Family History  Problem Relation Age of Onset   Diabetes Mother    Diverticulitis Mother    Colon polyps Mother    Bone cancer Father    Macular degeneration Maternal Grandmother    Heart disease Maternal Grandmother    Hypertension Maternal Grandfather    Heart disease Maternal Grandfather    Asthma Other    Obesity Other    Heart disease Other    Thyroid  cancer Other        uncertain cell type.  several distant relatives--all deceased   Colon cancer Neg Hx    Esophageal cancer Neg Hx    Pancreatic cancer Neg Hx    Stomach cancer Neg Hx    Liver disease Neg Hx    Rectal cancer Neg Hx    Ovarian cancer Neg Hx    Breast cancer Neg Hx     Social History Social History   Tobacco Use    Smoking status: Never   Smokeless tobacco: Never  Vaping Use   Vaping status: Never Used  Substance Use Topics   Alcohol use: Yes    Comment: rare-occ. per pt   Drug use: No     Allergies   Certolizumab pegol, Levothyroxine , and Sumatriptan    Review of Systems Review of Systems   Physical Exam Triage Vital Signs  ED Triage Vitals  Encounter Vitals Group     BP 12/27/23 1415 116/77     Systolic BP Percentile --      Diastolic BP Percentile --      Pulse Rate 12/27/23 1415 87     Resp 12/27/23 1415 16     Temp 12/27/23 1415 97.8 F (36.6 C)     Temp Source 12/27/23 1415 Temporal     SpO2 12/27/23 1415 97 %     Weight --      Height --      Head Circumference --      Peak Flow --      Pain Score 12/27/23 1423 5     Pain Loc --      Pain Education --      Exclude from Growth Chart --    No data found.  Updated Vital Signs BP 116/77 (BP Location: Left Arm)   Pulse 87   Temp 97.8 F (36.6 C) (Temporal)   Resp 16   SpO2 97%   Visual Acuity Right Eye Distance:   Left Eye Distance:   Bilateral Distance:    Right Eye Near:   Left Eye Near:    Bilateral Near:     Physical Exam Constitutional:      Appearance: Normal appearance.  HENT:     Head: Normocephalic.     Right Ear: Tympanic membrane, ear canal and external ear normal.     Left Ear: Tympanic membrane, ear canal and external ear normal.     Nose: Congestion present. No rhinorrhea.     Mouth/Throat:     Mouth: Mucous membranes are moist.     Pharynx: Oropharynx is clear.  Eyes:     Extraocular Movements: Extraocular movements intact.  Cardiovascular:     Rate and Rhythm: Normal rate and regular rhythm.     Pulses: Normal pulses.     Heart sounds: Normal heart sounds.  Pulmonary:     Effort: Pulmonary effort is normal.     Breath sounds: Normal breath sounds.  Musculoskeletal:     Cervical back: Normal range of motion and neck supple.  Neurological:     Mental Status: Joy Taylor is alert and  oriented to person, place, and time. Mental status is at baseline.      UC Treatments / Results  Labs (all labs ordered are listed, but only abnormal results are displayed) Labs Reviewed - No data to display  EKG   Radiology No results found.  Procedures Procedures (including critical care time)  Medications Ordered in UC Medications  ondansetron  (ZOFRAN -ODT) disintegrating tablet 4 mg (4 mg Oral Given 12/27/23 1433)    Initial Impression / Assessment and Plan / UC Course  I have reviewed the triage vital signs and the nursing notes.  Pertinent labs & imaging results that were available during my care of the patient were reviewed by me and considered in my medical decision making (see chart for details).  Acute URI  Patient is in no signs of distress nor toxic appearing.  Vital signs are stable.  Low suspicion for pneumonia, pneumothorax or bronchitis and therefore will defer imaging.  Prescribed Augmentin  and prednisone , declined prescription for cough medicine.May use additional over-the-counter medications as needed for supportive care.  May follow-up with urgent care as needed if symptoms persist or worsen. Note given.    Final Clinical Impressions(s) / UC Diagnoses   Final diagnoses:  Acute URI     Discharge Instructions  Begin Augmentin  every morning and every evening for 10 days  Begin prednisone  every morning with food to open and relax the airway making it easier for you to breathe    You can take Tylenol  and/or Ibuprofen as needed for fever reduction and pain relief.   For cough: honey 1/2 to 1 teaspoon (you can dilute the honey in water or another fluid).  You can also use guaifenesin and dextromethorphan for cough. You can use a humidifier for chest congestion and cough.  If you don't have a humidifier, you can sit in the bathroom with the hot shower running.      For sore throat: try warm salt water gargles, cepacol lozenges, throat spray, warm tea  or water with lemon/honey, popsicles or ice, or OTC cold relief medicine for throat discomfort.   For congestion: take a daily anti-histamine like Zyrtec, Claritin, and a oral decongestant, such as pseudoephedrine.  You can also use Flonase 1-2 sprays in each nostril daily.   It is important to stay hydrated: drink plenty of fluids (water, gatorade/powerade/pedialyte, juices, or teas) to keep your throat moisturized and help further relieve irritation/discomfort.    ED Prescriptions     Medication Sig Dispense Auth. Provider   amoxicillin -clavulanate (AUGMENTIN ) 875-125 MG tablet Take 1 tablet by mouth every 12 (twelve) hours for 10 days. 20 tablet Rusty Villella R, NP   predniSONE  (DELTASONE ) 20 MG tablet Take 2 tablets (40 mg total) by mouth daily. 10 tablet Verne Cove R, NP      PDMP not reviewed this encounter.   Reena Canning, NP 12/27/23 1447

## 2023-12-27 NOTE — Discharge Instructions (Signed)
 Begin Augmentin  every morning and every evening for 10 days  Begin prednisone  every morning with food to open and relax the airway making it easier for you to breathe    You can take Tylenol  and/or Ibuprofen as needed for fever reduction and pain relief.   For cough: honey 1/2 to 1 teaspoon (you can dilute the honey in water or another fluid).  You can also use guaifenesin and dextromethorphan for cough. You can use a humidifier for chest congestion and cough.  If you don't have a humidifier, you can sit in the bathroom with the hot shower running.      For sore throat: try warm salt water gargles, cepacol lozenges, throat spray, warm tea or water with lemon/honey, popsicles or ice, or OTC cold relief medicine for throat discomfort.   For congestion: take a daily anti-histamine like Zyrtec, Claritin, and a oral decongestant, such as pseudoephedrine.  You can also use Flonase 1-2 sprays in each nostril daily.   It is important to stay hydrated: drink plenty of fluids (water, gatorade/powerade/pedialyte, juices, or teas) to keep your throat moisturized and help further relieve irritation/discomfort.

## 2024-01-10 ENCOUNTER — Telehealth: Payer: Self-pay | Admitting: Emergency Medicine

## 2024-01-10 ENCOUNTER — Ambulatory Visit
Admission: RE | Admit: 2024-01-10 | Discharge: 2024-01-10 | Disposition: A | Discharge status: Home / Self Care | Payer: No Typology Code available for payment source | Source: Ambulatory Visit

## 2024-01-10 ENCOUNTER — Other Ambulatory Visit: Payer: Self-pay

## 2024-01-10 ENCOUNTER — Other Ambulatory Visit: Payer: No Typology Code available for payment source

## 2024-01-10 ENCOUNTER — Ambulatory Visit (INDEPENDENT_AMBULATORY_CARE_PROVIDER_SITE_OTHER): Payer: No Typology Code available for payment source

## 2024-01-10 VITALS — BP 119/85 | HR 98 | Temp 99.5°F | Resp 18

## 2024-01-10 DIAGNOSIS — J069 Acute upper respiratory infection, unspecified: Secondary | ICD-10-CM

## 2024-01-10 MED ORDER — LEVOFLOXACIN 500 MG PO TABS
500.0000 mg | ORAL_TABLET | Freq: Every day | ORAL | 0 refills | Status: AC
Start: 2024-01-10 — End: ?

## 2024-01-10 MED ORDER — PREDNISONE 10 MG (21) PO TBPK: ORAL_TABLET | Freq: Every day | ORAL | 0 refills | Status: AC

## 2024-01-10 NOTE — Discharge Instructions (Addendum)
 Chest x-ray is pending, you will be notified of results via telephone and any adjustments to your treatment plan discussed at that time  Begin Levaquin every morning  Take prednisone every morning with food as directed to open and relax the airway   You can take Tylenol and/or Ibuprofen as needed for fever reduction and pain relief.   For cough: honey 1/2 to 1 teaspoon (you can dilute the honey in water or another fluid).  You can also use guaifenesin and dextromethorphan for cough. You can use a humidifier for chest congestion and cough.  If you don't have a humidifier, you can sit in the bathroom with the hot shower running.      For sore throat: try warm salt water gargles, cepacol lozenges, throat spray, warm tea or water with lemon/honey, popsicles or ice, or OTC cold relief medicine for throat discomfort.   For congestion: take a daily anti-histamine like Zyrtec, Claritin, and a oral decongestant, such as pseudoephedrine.  You can also use Flonase 1-2 sprays in each nostril daily.   It is important to stay hydrated: drink plenty of fluids (water, gatorade/powerade/pedialyte, juices, or teas) to keep your throat moisturized and help further relieve irritation/discomfort.

## 2024-01-10 NOTE — ED Triage Notes (Addendum)
 Patient presents to [facility] for evaluation of headache, sinus pressure and fatigue since 3 weeks.  On Wednesday, migraine returned, more fatigue, and drainage became thicker, green, and coughing with wheeze.  Pain abs and back with coughing.

## 2024-01-10 NOTE — ED Provider Notes (Signed)
 Joy Taylor    CSN: [id number] Arrival date & time: 01/10/24  1101      History   Chief Complaint Chief Complaint  Patient presents with   URI    HPI Joy Taylor is a 28 y.o. female.   Patient presents for evaluation of increased fatigue, nasal congestion sputum headaches and nausea without vomiting present for 3 weeks.  Began to worsen 1 week ago.  Tolerating food and liquids.  Was seen 3 to 4 weeks ago in this urgent care, prescribed antibiotic and steroids, endorses symptoms improved but did not fully resolve.  Past Medical History:  Diagnosis Date   Anxiety    Depression    Ehlers-Danlos disease    Fibromyalgia    Genital herpes    diagnosed by GYN-per patient no treatment advised unless daily flare ups occur   GERD (gastroesophageal reflux disease)    HSV-2 (herpes simplex virus 2) infection    IBS (irritable bowel syndrome)    Kidney stones    Low BP    post-op   Lupus    Migraine    Rheumatoid arthritis (HCC)    Stomach problems    Thyroid disease     Patient Active Problem List   Diagnosis Date Noted   Cervicalgia 11/06/2020   Chronic lower back pain 11/06/2020   Severe obesity (BMI 35.0-39.9) with comorbidity (HCC) 11/06/2020   Vitamin D deficiency 05/07/2020   Palpitations 04/26/2020   Thyroid nodule 02/19/2020   Tremor 02/19/2020   Ehlers-Danlos disease 04/19/2017   Rheumatoid arthritis (HCC) 04/19/2017   Systemic lupus erythematosus (HCC) 04/19/2017   Fibromyalgia 04/19/2017   Migraine 04/19/2017    Past Surgical History:  Procedure Laterality Date   HIP ARTHROSCOPY Right 07/15/2016, 08/06/2015   KNEE ARTHROSCOPY Right    NISSEN FUNDOPLICATION  11/26/2016   ROTATOR CUFF REPAIR Right 2010   SHOULDER ARTHROSCOPY Right 06/29/2018   THYROIDECTOMY, PARTIAL  03/20/2020    OB History     Gravida  0   Para  0   Term  0   Preterm  0   AB  0   Living  0      SAB  0   IAB  0   Ectopic  0   Multiple  0   Live Births   0            Home Medications    Prior to Admission medications   Medication Sig Start Date End Date Taking? Authorizing Provider  levofloxacin (LEVAQUIN) 500 MG tablet Take 1 tablet (500 mg total) by mouth daily. 01/10/24  Yes Gunda Maqueda R, NP  predniSONE (STERAPRED UNI-PAK 21 TAB) 10 MG (21) TBPK tablet Take by mouth daily. Take 6 tabs by mouth daily  for 1 days, then 5 tabs for 1 days, then 4 tabs for 1 days, then 3 tabs for 1 days, 2 tabs for 1 days, then 1 tab by mouth daily for 1 days 01/10/24  Yes Amayra Kiedrowski R, NP  amoxicillin (AMOXIL) 500 MG capsule Take 1 capsule (500 mg total) by mouth 3 (three) times daily. 05/19/23   Linwood Dibbles, MD  aspirin-acetaminophen-caffeine (EXCEDRIN MIGRAINE) [phone] MG tablet Take 2 tablets by mouth every 6 (six) hours as needed for headache.     [provider]  azithromycin (ZITHROMAX) 250 MG tablet Take 1 tablet (250 mg total) by mouth daily. Take first 2 tablets together, then 1 every day until finished. 05/19/23   Linwood Dibbles, MD  Dorise Hiss (  AIMOVIG) 70 MG/ML SOAJ Inject 70 mg into the skin every 30 (thirty) days. 09/08/21   Glean Salvo, NP  escitalopram (LEXAPRO) 10 MG tablet Take 10 mg by mouth daily.    [provider]  ferrous sulfate 325 (65 FE) MG EC tablet Take 325 mg by mouth 3 (three) times daily with meals.    [provider]  fluticasone (FLONASE) 50 MCG/ACT nasal spray Place 2 sprays into both nostrils as needed for allergies or rhinitis (tries to take every other day). 03/03/19   [provider]  gabapentin (NEURONTIN) 600 MG tablet Take 1 tablet (600 mg total) by mouth 3 (three) times daily. 01/16/21   Glean Salvo, NP  glucose monitoring kit (FREESTYLE) monitoring kit 1 each by Does not apply route as needed for other. Patient taking differently: 1 each by Does not apply route as needed for other (testing). 01/25/19   [place], Betty G, MD  ketoconazole (NIZORAL) 2 % cream Apply 1  application topically daily as needed for irritation. 07/03/21   Deeann Saint, MD  levothyroxine (SYNTHROID) 25 MCG tablet Take 1 tablet (25 mcg total) by mouth daily before breakfast. Patient taking differently: Take 50 mcg by mouth daily before breakfast. 05/07/20   Romero Belling, MD  MELATONIN PO Take 2 tablets by mouth as needed.    [provider]  methocarbamol (ROBAXIN) 500 MG tablet Take 500 mg by mouth 4 (four) times daily.    [provider]  Norethindrone Acetate-Ethinyl Estradiol (AUROVELA 1.5/30) 1.5-30 MG-MCG tablet Take 1 tablet by mouth daily. 10/01/21 10/01/22  Linzie Collin, MD  ondansetron (ZOFRAN-ODT) 8 MG disintegrating tablet Take 1 tablet (8 mg total) by mouth every 8 (eight) hours as needed for nausea or vomiting. 05/19/23   Linwood Dibbles, MD  Probiotic Product (PROBIOTIC-10 PO) Take 1 tablet by mouth daily.    [provider]  RITUXIMAB-ARRX IV Inject into the vein.    [provider]  SYSTANE BALANCE 0.6 % SOLN Apply 1 drop to eye daily. 08/25/18   [provider]  tiZANidine (ZANAFLEX) 4 MG tablet Take 1 tablet (4 mg total) by mouth every 8 (eight) hours as needed for muscle spasms. 11/28/19   [place], Betty G, MD  traZODone (DESYREL) 50 MG tablet Take 50 mg by mouth at bedtime as needed for sleep (mood). 06/25/21   [provider]    Family History Family History  Problem Relation Age of Onset   Diabetes Mother    Diverticulitis Mother    Colon polyps Mother    Bone cancer Father    Macular degeneration Maternal Grandmother    Heart disease Maternal Grandmother    Hypertension Maternal Grandfather    Heart disease Maternal Grandfather    Asthma Other    Obesity Other    Heart disease Other    Thyroid cancer Other        uncertain cell type.  several distant relatives--all deceased   Colon cancer Neg Hx    Esophageal cancer Neg Hx    Pancreatic cancer Neg Hx    Stomach cancer Neg Hx    Liver disease Neg  Hx    Rectal cancer Neg Hx    Ovarian cancer Neg Hx    Breast cancer Neg Hx     Social History Social History   Tobacco Use   Smoking status: Never   Smokeless tobacco: Never  Vaping Use   Vaping status: Never Used  Substance Use Topics  Alcohol use: Yes    Comment: rare-occ. per pt   Drug use: No     Allergies   Certolizumab pegol, Levothyroxine, and Sumatriptan   Review of Systems Review of Systems   Physical Exam Triage Vital Signs ED Triage Vitals  Encounter Vitals Group     BP 01/10/24 1117 119/85     Systolic BP Percentile --      Diastolic BP Percentile --      Pulse Rate 01/10/24 1117 98     Resp 01/10/24 1117 18     Temp 01/10/24 1117 99.5 F (37.5 C)     Temp Source 01/10/24 1117 Oral     SpO2 01/10/24 1117 98 %     Weight --      Height --      Head Circumference --      Peak Flow --      Pain Score 01/10/24 1116 6     Pain Loc --      Pain Education --      Exclude from Growth Chart --    No data found.  Updated Vital Signs BP 119/85 (BP Location: Right Arm)   Pulse 98   Temp 99.5 F (37.5 C) (Oral)   Resp 18   LMP 12/08/2023   SpO2 98%   Visual Acuity Right Eye Distance:   Left Eye Distance:   Bilateral Distance:    Right Eye Near:   Left Eye Near:    Bilateral Near:     Physical Exam Constitutional:      Appearance: Normal appearance.  HENT:     Head: Normocephalic.     Right Ear: Tympanic membrane, ear canal and external ear normal.     Left Ear: Tympanic membrane, ear canal and external ear normal.     Nose: Congestion present. No rhinorrhea.     Mouth/Throat:     Pharynx: No oropharyngeal exudate or posterior oropharyngeal erythema.  Eyes:     Extraocular Movements: Extraocular movements intact.  Cardiovascular:     Rate and Rhythm: Normal rate and regular rhythm.     Pulses: Normal pulses.     Heart sounds: Normal heart sounds.  Pulmonary:     Effort: Pulmonary effort is normal.     Breath sounds: Normal  breath sounds.  Neurological:     Mental Status: She is alert and oriented to person, place, and time. Mental status is at baseline.      UC Treatments / Results  Labs (all labs ordered are listed, but only abnormal results are displayed) Labs Reviewed - No data to display  EKG   Radiology No results found.  Procedures Procedures (including critical care time)  Medications Ordered in UC Medications - No data to display  Initial Impression / Assessment and Plan / UC Course  I have reviewed the triage vital signs and the nursing notes.  Pertinent labs & imaging results that were available during my care of the patient were reviewed by me and considered in my medical decision making (see chart for details).  Acute URI  Patient is in no signs of distress nor toxic appearing.  Vital signs are stable.  Low suspicion for pneumonia, pneumothorax or bronchitis and therefore will defer imaging.  Chest x-ray pending.  Prescribed Levaquin and prednisone.May use additional over-the-counter medications as needed for supportive care.  May follow-up with urgent care as needed if symptoms persist or worsen.   Final Clinical Impressions(s) / UC Diagnoses   Final  diagnoses:  Acute URI     Discharge Instructions      Chest x-ray is pending, you will be notified of results via telephone and any adjustments to your treatment plan discussed at that time  Begin Levaquin every morning  Take prednisone every morning with food as directed to open and relax the airway   You can take Tylenol and/or Ibuprofen as needed for fever reduction and pain relief.   For cough: honey 1/2 to 1 teaspoon (you can dilute the honey in water or another fluid).  You can also use guaifenesin and dextromethorphan for cough. You can use a humidifier for chest congestion and cough.  If you don't have a humidifier, you can sit in the bathroom with the hot shower running.      For sore throat: try warm salt water  gargles, cepacol lozenges, throat spray, warm tea or water with lemon/honey, popsicles or ice, or OTC cold relief medicine for throat discomfort.   For congestion: take a daily anti-histamine like Zyrtec, Claritin, and a oral decongestant, such as pseudoephedrine.  You can also use Flonase 1-2 sprays in each nostril daily.   It is important to stay hydrated: drink plenty of fluids (water, gatorade/powerade/pedialyte, juices, or teas) to keep your throat moisturized and help further relieve irritation/discomfort.    ED Prescriptions     Medication Sig Dispense Auth. Provider   levofloxacin (LEVAQUIN) 500 MG tablet Take 1 tablet (500 mg total) by mouth daily. 7 tablet Kahleb Mcclane R, NP   predniSONE (STERAPRED UNI-PAK 21 TAB) 10 MG (21) TBPK tablet Take by mouth daily. Take 6 tabs by mouth daily  for 1 days, then 5 tabs for 1 days, then 4 tabs for 1 days, then 3 tabs for 1 days, 2 tabs for 1 days, then 1 tab by mouth daily for 1 days 21 tablet Ellanie Oppedisano, Elita Boone, NP      PDMP not reviewed this encounter.   Valinda Hoar, NP 01/10/24 1229

## 2024-01-10 NOTE — Telephone Encounter (Signed)
 Reported x-ray results via telephone, 2 patient identifiers used, will continue treatment plan as discussed with follow-up with PCP

## 2024-03-08 ENCOUNTER — Other Ambulatory Visit: Payer: Self-pay | Admitting: Physical Medicine and Rehabilitation

## 2024-03-08 DIAGNOSIS — M25561 Pain in right knee: Secondary | ICD-10-CM

## 2024-03-10 ENCOUNTER — Ambulatory Visit
Admission: RE | Admit: 2024-03-10 | Discharge: 2024-03-10 | Disposition: A | Discharge status: Home / Self Care | Source: Ambulatory Visit | Attending: Physical Medicine and Rehabilitation | Admitting: Physical Medicine and Rehabilitation

## 2024-03-10 DIAGNOSIS — M25561 Pain in right knee: Secondary | ICD-10-CM | POA: Diagnosis present
